# Patient Record
Sex: Male | Born: 1990 | Race: Black or African American | Hispanic: No | Smoking: Never smoker
Health system: Southern US, Community
[De-identification: ages and names within clinical notes are randomized; demographics above are authoritative.]

## PROBLEM LIST (undated history)

## (undated) DIAGNOSIS — K279 Peptic ulcer, site unspecified, unspecified as acute or chronic, without hemorrhage or perforation: Secondary | ICD-10-CM

## (undated) DIAGNOSIS — B009 Herpesviral infection, unspecified: Secondary | ICD-10-CM

## (undated) DIAGNOSIS — K922 Gastrointestinal hemorrhage, unspecified: Secondary | ICD-10-CM

## (undated) DIAGNOSIS — D62 Acute posthemorrhagic anemia: Secondary | ICD-10-CM

## (undated) HISTORY — DX: Acute posthemorrhagic anemia: D62

## (undated) HISTORY — DX: Gastrointestinal hemorrhage, unspecified: K92.2

## (undated) HISTORY — PX: ESOPHAGOGASTRODUODENOSCOPY ENDOSCOPY: SHX5814

## (undated) HISTORY — PX: WISDOM TOOTH EXTRACTION: SHX21

---

## 2013-10-04 ENCOUNTER — Inpatient Hospital Stay (HOSPITAL_COMMUNITY)
Admission: EM | Admit: 2013-10-04 | Discharge: 2013-10-08 | DRG: 378 | Disposition: A | Discharge status: Home / Self Care | Payer: BC Managed Care – PPO | Attending: Internal Medicine | Admitting: Internal Medicine

## 2013-10-04 ENCOUNTER — Encounter (HOSPITAL_COMMUNITY): Payer: Self-pay | Admitting: Emergency Medicine

## 2013-10-04 DIAGNOSIS — R7989 Other specified abnormal findings of blood chemistry: Secondary | ICD-10-CM | POA: Diagnosis present

## 2013-10-04 DIAGNOSIS — D509 Iron deficiency anemia, unspecified: Secondary | ICD-10-CM | POA: Diagnosis present

## 2013-10-04 DIAGNOSIS — Z79899 Other long term (current) drug therapy: Secondary | ICD-10-CM

## 2013-10-04 DIAGNOSIS — K2980 Duodenitis without bleeding: Secondary | ICD-10-CM | POA: Diagnosis present

## 2013-10-04 DIAGNOSIS — R000 Tachycardia, unspecified: Secondary | ICD-10-CM | POA: Diagnosis present

## 2013-10-04 DIAGNOSIS — K279 Peptic ulcer, site unspecified, unspecified as acute or chronic, without hemorrhage or perforation: Secondary | ICD-10-CM | POA: Diagnosis present

## 2013-10-04 DIAGNOSIS — R609 Edema, unspecified: Secondary | ICD-10-CM | POA: Diagnosis present

## 2013-10-04 DIAGNOSIS — D62 Acute posthemorrhagic anemia: Secondary | ICD-10-CM | POA: Diagnosis present

## 2013-10-04 DIAGNOSIS — Z8711 Personal history of peptic ulcer disease: Secondary | ICD-10-CM

## 2013-10-04 DIAGNOSIS — K922 Gastrointestinal hemorrhage, unspecified: Secondary | ICD-10-CM

## 2013-10-04 DIAGNOSIS — B009 Herpesviral infection, unspecified: Secondary | ICD-10-CM | POA: Diagnosis present

## 2013-10-04 DIAGNOSIS — K264 Chronic or unspecified duodenal ulcer with hemorrhage: Principal | ICD-10-CM | POA: Diagnosis present

## 2013-10-04 DIAGNOSIS — J029 Acute pharyngitis, unspecified: Secondary | ICD-10-CM | POA: Diagnosis not present

## 2013-10-04 HISTORY — DX: Herpesviral infection, unspecified: B00.9

## 2013-10-04 HISTORY — DX: Peptic ulcer, site unspecified, unspecified as acute or chronic, without hemorrhage or perforation: K27.9

## 2013-10-04 LAB — BASIC METABOLIC PANEL
BUN: 26 mg/dL — ABNORMAL HIGH (ref 6–23)
CALCIUM: 9.1 mg/dL (ref 8.4–10.5)
CO2: 23 mEq/L (ref 19–32)
Chloride: 102 mEq/L (ref 96–112)
Creatinine, Ser: 1.1 mg/dL (ref 0.50–1.35)
GFR calc non Af Amer: >90 mL/min (ref >90)
Glucose, Bld: 134 mg/dL — ABNORMAL HIGH (ref 70–99)
POTASSIUM: 4.1 meq/L (ref 3.7–5.3)
Sodium: 137 mEq/L (ref 137–147)

## 2013-10-04 LAB — CBC WITH DIFFERENTIAL/PLATELET
BASOS PCT: 0 % (ref 0–1)
Basophils Absolute: 0 10*3/uL (ref 0.0–0.1)
Eosinophils Absolute: 0.1 10*3/uL (ref 0.0–0.7)
Eosinophils Relative: 1 % (ref 0–5)
HCT: 35.6 % — ABNORMAL LOW (ref 39.0–52.0)
HEMOGLOBIN: 11.7 g/dL — AB (ref 13.0–17.0)
LYMPHS ABS: 1.4 10*3/uL (ref 0.7–4.0)
Lymphocytes Relative: 13 % (ref 12–46)
MCH: 26.3 pg (ref 26.0–34.0)
MCHC: 32.9 g/dL (ref 30.0–36.0)
MCV: 80 fL (ref 78.0–100.0)
MONOS PCT: 7 % (ref 3–12)
Monocytes Absolute: 0.7 10*3/uL (ref 0.1–1.0)
NEUTROS ABS: 8.2 10*3/uL — AB (ref 1.7–7.7)
Neutrophils Relative %: 79 % — ABNORMAL HIGH (ref 43–77)
PLATELETS: 256 10*3/uL (ref 150–400)
RBC: 4.45 MIL/uL (ref 4.22–5.81)
RDW: 14.3 % (ref 11.5–15.5)
WBC: 10.4 10*3/uL (ref 4.0–10.5)

## 2013-10-04 NOTE — ED Notes (Signed)
Pt presents with c/o rectal bleeding that started today. Pt says that the bleeding has been both bright red blood and dark blood, also experiencing some abdominal pain and dizziness. Ambulatory in triage.

## 2013-10-05 ENCOUNTER — Encounter (HOSPITAL_COMMUNITY)
Admission: EM | Disposition: A | Discharge status: Home / Self Care | Payer: Self-pay | Source: Home / Self Care | Attending: Internal Medicine

## 2013-10-05 ENCOUNTER — Encounter (HOSPITAL_COMMUNITY): Payer: Self-pay | Admitting: Internal Medicine

## 2013-10-05 ENCOUNTER — Emergency Department (HOSPITAL_COMMUNITY): Payer: BC Managed Care – PPO

## 2013-10-05 DIAGNOSIS — K922 Gastrointestinal hemorrhage, unspecified: Secondary | ICD-10-CM

## 2013-10-05 DIAGNOSIS — K279 Peptic ulcer, site unspecified, unspecified as acute or chronic, without hemorrhage or perforation: Secondary | ICD-10-CM | POA: Diagnosis present

## 2013-10-05 HISTORY — DX: Gastrointestinal hemorrhage, unspecified: K92.2

## 2013-10-05 HISTORY — PX: ESOPHAGOGASTRODUODENOSCOPY: SHX5428

## 2013-10-05 LAB — URINALYSIS, ROUTINE W REFLEX MICROSCOPIC
Bilirubin Urine: NEGATIVE
GLUCOSE, UA: NEGATIVE mg/dL
HGB URINE DIPSTICK: NEGATIVE
Ketones, ur: NEGATIVE mg/dL
LEUKOCYTES UA: NEGATIVE
Nitrite: NEGATIVE
PROTEIN: NEGATIVE mg/dL
SPECIFIC GRAVITY, URINE: 1.028 (ref 1.005–1.030)
UROBILINOGEN UA: 0.2 mg/dL (ref 0.0–1.0)
pH: 6 (ref 5.0–8.0)

## 2013-10-05 LAB — COMPREHENSIVE METABOLIC PANEL
ALT: 13 U/L (ref 0–53)
AST: 18 U/L (ref 0–37)
Albumin: 3.2 g/dL — ABNORMAL LOW (ref 3.5–5.2)
Alkaline Phosphatase: 42 U/L (ref 39–117)
BUN: 27 mg/dL — AB (ref 6–23)
CO2: 24 mEq/L (ref 19–32)
CREATININE: 1.09 mg/dL (ref 0.50–1.35)
Calcium: 8.4 mg/dL (ref 8.4–10.5)
Chloride: 104 mEq/L (ref 96–112)
GFR calc non Af Amer: >90 mL/min (ref >90)
GLUCOSE: 112 mg/dL — AB (ref 70–99)
POTASSIUM: 4.2 meq/L (ref 3.7–5.3)
Sodium: 139 mEq/L (ref 137–147)
TOTAL PROTEIN: 5.7 g/dL — AB (ref 6.0–8.3)
Total Bilirubin: 0.4 mg/dL (ref 0.3–1.2)

## 2013-10-05 LAB — HEMOGLOBIN AND HEMATOCRIT, BLOOD
HCT: 27.6 % — ABNORMAL LOW (ref 39.0–52.0)
HEMATOCRIT: 30.4 % — AB (ref 39.0–52.0)
HEMATOCRIT: 27.4 % — AB (ref 39.0–52.0)
HEMOGLOBIN: 9.4 g/dL — AB (ref 13.0–17.0)
Hemoglobin: 10.4 g/dL — ABNORMAL LOW (ref 13.0–17.0)
Hemoglobin: 9.2 g/dL — ABNORMAL LOW (ref 13.0–17.0)

## 2013-10-05 LAB — MAGNESIUM: MAGNESIUM: 1.7 mg/dL (ref 1.5–2.5)

## 2013-10-05 LAB — CBC
HEMATOCRIT: 30 % — AB (ref 39.0–52.0)
Hemoglobin: 10.2 g/dL — ABNORMAL LOW (ref 13.0–17.0)
MCH: 27.1 pg (ref 26.0–34.0)
MCHC: 34 g/dL (ref 30.0–36.0)
MCV: 79.6 fL (ref 78.0–100.0)
PLATELETS: 225 10*3/uL (ref 150–400)
RBC: 3.77 MIL/uL — ABNORMAL LOW (ref 4.22–5.81)
RDW: 14.2 % (ref 11.5–15.5)
WBC: 14.1 10*3/uL — AB (ref 4.0–10.5)

## 2013-10-05 LAB — TYPE AND SCREEN
ABO/RH(D): O POS
Antibody Screen: NEGATIVE

## 2013-10-05 LAB — PHOSPHORUS: Phosphorus: 2.5 mg/dL (ref 2.3–4.6)

## 2013-10-05 LAB — ABO/RH: ABO/RH(D): O POS

## 2013-10-05 LAB — TSH: TSH: 1.005 u[IU]/mL (ref 0.350–4.500)

## 2013-10-05 LAB — POC OCCULT BLOOD, ED: FECAL OCCULT BLD: POSITIVE — AB

## 2013-10-05 SURGERY — EGD (ESOPHAGOGASTRODUODENOSCOPY)
Anesthesia: Moderate Sedation

## 2013-10-05 MED ORDER — DIPHENHYDRAMINE HCL 50 MG/ML IJ SOLN
INTRAMUSCULAR | Status: AC
  Filled 2013-10-05: qty 1

## 2013-10-05 MED ORDER — MIDAZOLAM HCL 10 MG/2ML IJ SOLN
INTRAMUSCULAR | Status: AC
  Filled 2013-10-05: qty 2

## 2013-10-05 MED ORDER — VALACYCLOVIR HCL 500 MG PO TABS
250.0000 mg | ORAL_TABLET | Freq: Every day | ORAL | Status: DC
  Administered 2013-10-05 – 2013-10-08 (×4): 250 mg via ORAL
  Filled 2013-10-05 (×4): qty 0.5

## 2013-10-05 MED ORDER — SUCRALFATE 1 G PO TABS
1.0000 g | ORAL_TABLET | Freq: Three times a day (TID) | ORAL | Status: DC
  Administered 2013-10-06 – 2013-10-08 (×9): 1 g via ORAL
  Filled 2013-10-05 (×15): qty 1

## 2013-10-05 MED ORDER — ONDANSETRON HCL 4 MG/2ML IJ SOLN: 4.0000 mg | Freq: Four times a day (QID) | INTRAMUSCULAR | Status: DC | PRN

## 2013-10-05 MED ORDER — FENTANYL CITRATE 0.05 MG/ML IJ SOLN
INTRAMUSCULAR | Status: DC | PRN
  Administered 2013-10-05 (×4): 25 ug via INTRAVENOUS

## 2013-10-05 MED ORDER — FENTANYL CITRATE 0.05 MG/ML IJ SOLN
INTRAMUSCULAR | Status: AC
  Filled 2013-10-05: qty 2

## 2013-10-05 MED ORDER — ACETAMINOPHEN 325 MG PO TABS: 650.0000 mg | ORAL_TABLET | Freq: Four times a day (QID) | ORAL | Status: DC | PRN

## 2013-10-05 MED ORDER — SODIUM CHLORIDE 0.9 % IV SOLN
INTRAVENOUS | Status: DC
Start: 2013-10-05 — End: 2013-10-05
  Administered 2013-10-05: 500 mL via INTRAVENOUS

## 2013-10-05 MED ORDER — HYDROCODONE-ACETAMINOPHEN 5-325 MG PO TABS
1.0000 | ORAL_TABLET | ORAL | Status: DC | PRN
  Administered 2013-10-07: 1 via ORAL
  Filled 2013-10-05: qty 1

## 2013-10-05 MED ORDER — ONDANSETRON HCL 4 MG PO TABS: 4.0000 mg | ORAL_TABLET | Freq: Four times a day (QID) | ORAL | Status: DC | PRN

## 2013-10-05 MED ORDER — SODIUM CHLORIDE 0.9 % IV SOLN
8.0000 mg/h | INTRAVENOUS | Status: DC
  Administered 2013-10-05: 8 mg/h via INTRAVENOUS
  Filled 2013-10-05 (×4): qty 80

## 2013-10-05 MED ORDER — DIPHENHYDRAMINE HCL 50 MG/ML IJ SOLN
INTRAMUSCULAR | Status: DC | PRN
  Administered 2013-10-05: 25 mg via INTRAVENOUS

## 2013-10-05 MED ORDER — SODIUM CHLORIDE 0.9 % IV SOLN
80.0000 mg | Freq: Once | INTRAVENOUS | Status: AC
  Administered 2013-10-05: 80 mg via INTRAVENOUS
  Filled 2013-10-05: qty 80

## 2013-10-05 MED ORDER — SODIUM CHLORIDE 0.9 % IV SOLN
INTRAVENOUS | Status: DC
  Administered 2013-10-05: 03:00:00 via INTRAVENOUS

## 2013-10-05 MED ORDER — SODIUM CHLORIDE 0.9 % IJ SOLN
3.0000 mL | Freq: Two times a day (BID) | INTRAMUSCULAR | Status: DC
  Administered 2013-10-05 – 2013-10-07 (×5): 3 mL via INTRAVENOUS

## 2013-10-05 MED ORDER — SODIUM CHLORIDE 0.9 % IJ SOLN
INTRAMUSCULAR | Status: DC | PRN
  Administered 2013-10-05: 16:00:00

## 2013-10-05 MED ORDER — ACETAMINOPHEN 650 MG RE SUPP: 650.0000 mg | Freq: Four times a day (QID) | RECTAL | Status: DC | PRN

## 2013-10-05 MED ORDER — MIDAZOLAM HCL 10 MG/2ML IJ SOLN
INTRAMUSCULAR | Status: DC | PRN
  Administered 2013-10-05 (×5): 2 mg via INTRAVENOUS

## 2013-10-05 MED ORDER — PANTOPRAZOLE SODIUM 40 MG IV SOLR
40.0000 mg | Freq: Two times a day (BID) | INTRAVENOUS | Status: DC
  Administered 2013-10-08: 40 mg via INTRAVENOUS
  Filled 2013-10-05 (×2): qty 40

## 2013-10-05 NOTE — Op Note (Signed)
[facility] [address] [place], [id number]   OPERATIVE PROCEDURE REPORT  PATIENT: [name] ShortsCrawford, [name]  MR#: [id number] BIRTHDATE: 05/18/1991  GENDER: Male ENDOSCOPIST: Jeani HawkingPatrick Brenn Deziel, MD ASSISTANT:   Kandice RobinsonsGuillaume Awaka, technician and Harold BarbanLiz Honeycutt, RN PROCEDURE DATE: 10/05/2013 PROCEDURE:   EGD with control of bleeding ASA CLASS:   Class II INDICATIONS: Melena, Hematochezia, Epigastric pain, and Anemia  MEDICATIONS: Versed 10 mg IV, Fentanyl 100 mcg IV, and Benadryl 25 mg IV TOPICAL ANESTHETIC:   none  DESCRIPTION OF PROCEDURE:   After the risks benefits and alternatives of the procedure were thoroughly explained, informed consent was obtained.  The PENTAX GASTOROSCOPE [id number]  endoscope was introduced through the mouth  and advanced to the first portion of the duodenum Without limitations.      The instrument was slowly withdrawn as the mucosa was fully examined.   FINDINGS: The esophagus was normal.  In the gastric lumen there was evidence of fresh blood, but no site of bleeding was identified. The endoscope was advanced into the duodenal bulb and an actively bleeding ulcer was found.  The ulcer was 1 cm in size and there was a surrounding duodenitis.  A visible vessel was noted on the periphery.  Four quadrant injections of 1:10,000 Epi was performed, which arrested the bleeding.  BICAP was applied with the 7 Fr probe.  The treatment was very difficult to perform as he was combative with the procedure.  Successful cautery was achieved, but to ensure complete hemostasis four hemoclips were deployed.  The ulcer was not able to be closed because of the fibrosis and edema, but the edge where the visible vessel was cauterized was cliped. No further bleeding was noted.          The scope was then withdrawn from the patient and the procedure terminated.  COMPLICATIONS: There were no complications. IMPRESSION: 1) Bleeding duodenal ulcer. 2)  Duodenitis.  RECOMMENDATIONS: 1) PPI BID. 2) Sucralfate 1 gram QID. 3) Check H. pylori status. 4) Follow HGB and transfuse as necessary.  _______________________________ eSignedJeani Hawking:  Laurice Kimmons, MD 10/05/2013 4:31 PM

## 2013-10-05 NOTE — ED Provider Notes (Signed)
CSN: [id number]     Arrival date & time 10/04/13  1814 History   First MD Initiated Contact with Patient 10/05/13 [phone]     Chief Complaint  Patient presents with  . Rectal Bleeding  . Abdominal Pain     (Consider location/radiation/quality/duration/timing/severity/associated sxs/prior Treatment) Patient is a 23 y.o. male presenting with hematochezia and abdominal pain.  Rectal Bleeding Associated symptoms: abdominal pain   Associated symptoms: no fever   Abdominal Pain Associated symptoms: hematochezia   Associated symptoms: no chest pain, no chills, no dysuria, no fever and no shortness of breath    History provided by patient. Epigastric pain onset yesterday and since then has developed black and tarry stools mixed with some bright red blood. He has history of a bleeding ulcer in 2012 while he [place]. He denies any recent NSAIDs. He does drink some occasional alcohol.  He admits to a significant amount of stress at work. Pain is sharp in quality and moderate severity. Pain is not radiating. No nausea vomiting. Symptoms feel similar to previous ulcer. Last bowel movement tonight describes moderate amount of blood in the toilet  History reviewed. No pertinent past medical history. History reviewed. No pertinent past surgical history. No family history on file. History  Substance Use Topics  . Smoking status: Never Smoker   . Smokeless tobacco: Not on file  . Alcohol Use: Yes     Comment: rarely     Review of Systems  Constitutional: Negative for fever and chills.  Respiratory: Negative for shortness of breath.   Cardiovascular: Negative for chest pain.  Gastrointestinal: Positive for abdominal pain, blood in stool and hematochezia.  Genitourinary: Negative for dysuria.  Musculoskeletal: Negative for back pain, neck pain and neck stiffness.  Skin: Negative for rash.  Neurological: Negative for headaches.  All other systems reviewed and are  negative.      Allergies  Review of patient's allergies indicates no known allergies.  Home Medications   Current Outpatient Rx  Name  Route  Sig  Dispense  Refill  . valACYclovir (VALTREX) 500 MG tablet   Oral   Take 250 mg by mouth daily. Take 1/2 tablet          BP 115/70  Pulse 92  Temp(Src) 98.4 F (36.9 C) (Oral)  Resp 18  SpO2 98% Physical Exam  Constitutional: He is oriented to person, place, and time. He appears well-developed and well-nourished.  HENT:  Head: Normocephalic and atraumatic.  Mouth/Throat: Oropharynx is clear and moist.  Eyes: Conjunctivae and EOM are normal. Pupils are equal, round, and reactive to light.  Neck: Neck supple.  Cardiovascular: Regular rhythm and intact distal pulses.   Tachycardic  Pulmonary/Chest: Effort normal and breath sounds normal. No respiratory distress.  Abdominal: Soft. Bowel sounds are normal. He exhibits no distension.  Mild epigastric tenderness without rebound or guarding  Genitourinary:  Rectal exam: Nontender no masses, no stool in rectal vault  Musculoskeletal: Normal range of motion. He exhibits no edema.  Neurological: He is alert and oriented to person, place, and time.  Skin: Skin is warm and dry.    ED Course  Procedures (including critical care time) Labs Review Labs Reviewed  BASIC METABOLIC PANEL - Abnormal; Notable for the following:    Glucose, Bld 134 (*)    BUN 26 (*)    All other components within normal limits  CBC WITH DIFFERENTIAL - Abnormal; Notable for the following:    Hemoglobin 11.7 (*)  HCT 35.6 (*)    Neutrophils Relative % 79 (*)    Neutro Abs 8.2 (*)    All other components within normal limits  POC OCCULT BLOOD, ED - Abnormal; Notable for the following:    Fecal Occult Bld POSITIVE (*)    All other components within normal limits  URINALYSIS, ROUTINE W REFLEX MICROSCOPIC  TYPE AND SCREEN   Imaging Review Dg Chest 2 View  10/05/2013   CLINICAL DATA:  23 year old male  with pain and nausea.  EXAM: CHEST  2 VIEW  COMPARISON:  None  FINDINGS: The cardiomediastinal silhouette is unremarkable.  The lungs are clear.  There is no evidence of focal airspace disease, pulmonary edema, suspicious pulmonary nodule/mass, pleural effusion, or pneumothorax. No acute bony abnormalities are identified.  IMPRESSION: No active cardiopulmonary disease.   Electronically Signed   By: Laveda Abbe M.D.   On: 10/05/2013 03:00    EKG Interpretation   None      IV Protonix drip. IV fluids. Type and screen  2:35 AM discussed with Dr. Loreta Ave, GI will follow  2:43 AM d/w DR Adela Glimpse, will evaluate for admit  MDM   Dx: GI Bleed  Labs and imaging obtained and reviewed. IV fluids. IV medications. GI will follow. Medical admission    Sunnie Nielsen, MD 10/05/13 [phone]

## 2013-10-05 NOTE — H&P (Signed)
PCP: none   Chief Complaint:  Black stools with maroon blood  HPI: [name] Phillips is a 23 y.o. male   has a past medical history of PUD (peptic ulcer disease) and Herpes.   Presented with  24 hour hx of black stool but progressed to bright red blood. Last BM 3 hours ago. Reports some lower abdominal discomfort. Denies recent NSAID use. Denies epigastric pain. Initially was noted to be tachycardic up to 120's after IV improved. Hg 11.7. [facility]. ER spoke with Dr. Loreta AveMann who is aware of the patient. Hospitlist called for admission.   Review of Systems:    Pertinent positives include: melena, abdominal pain, diarrhea,  Constitutional:  No weight loss, night sweats, Fevers, chills, fatigue, weight loss  HEENT:  No headaches, Difficulty swallowing,Tooth/dental problems,Sore throat,  No sneezing, itching, ear ache, nasal congestion, post nasal drip,  Cardio-vascular:  No chest pain, Orthopnea, PND, anasarca, dizziness, palpitations.no Bilateral lower extremity swelling  GI:  No heartburn, indigestion,  nausea, vomiting,  change in bowel habits, loss of appetite,  blood in stool, hematemesis Resp:  no shortness of breath at rest. No dyspnea on exertion, No excess mucus, no productive cough, No non-productive cough, No coughing up of blood.No change in color of mucus.No wheezing. Skin:  no rash or lesions. No jaundice GU:  no dysuria, change in color of urine, no urgency or frequency. No straining to urinate.  No flank pain.  Musculoskeletal:  No joint pain or no joint swelling. No decreased range of motion. No back pain.  Psych:  No change in mood or affect. No depression or anxiety. No memory loss.  Neuro: no localizing neurological complaints, no tingling, no weakness, no double vision, no gait abnormality, no slurred speech, no confusion  Otherwise ROS are negative except for above, 10 systems were reviewed  Past  Medical History: Past Medical History  Diagnosis Date  . PUD (peptic ulcer disease)   . Herpes    History reviewed. No pertinent past surgical history.   Medications: Prior to Admission medications   Medication Sig Start Date End Date Taking? Authorizing Provider  valACYclovir (VALTREX) 500 MG tablet Take 250 mg by mouth daily. Take 1/2 tablet   Yes Historical Provider, MD    Allergies:  No Known Allergies  Social History:  Ambulatory  Independently  Lives at   Home alone   reports that he has never smoked. He does not have any smokeless tobacco history on file. He reports that he drinks alcohol. He reports that he does not use illicit drugs.   Family History: family history includes Asthma in his brother; CAD in his mother; Diabetes Mellitus II in his mother; Hypertension in his father; Prostate cancer in his father; Stroke in his mother.    Physical Exam: Patient Vitals for the past 24 hrs:  BP Temp Temp src Pulse Resp SpO2  10/05/13 0342 91/56 mmHg 98.5 F (36.9 C) Oral 93 - 97 %  10/04/13 2320 115/70 mmHg 98.4 F (36.9 C) Oral 92 18 98 %  10/04/13 2029 109/57 mmHg 97.7 F (36.5 C) Oral 125 22 100 %  10/04/13 1930 124/70 mmHg 98.7 F (37.1 C) Oral 107 20 100 %    1. General:  in No Acute distress 2. Psychological: Alert and   Oriented 3. Head/ENT:   Moist   Mucous Membranes  Head Non traumatic, neck supple                          Normal  Dentition 4. SKIN: normal  Skin turgor,  Skin clean Dry and intact no rash 5. Heart: Regular rate and rhythm no Murmur, Rub or gallop 6. Lungs: Clear to auscultation bilaterally, no wheezes or crackles   7. Abdomen: Soft, gastric tenderness as well as periumbilical tenderness, Non distended 8. Lower extremities: no clubbing, cyanosis, or edema 9. Neurologically Grossly intact, moving all 4 extremities equally 10. MSK: Normal range of motion Hemoccult possitive  body mass index is unknown because  there is no height or weight on file.   Labs on Admission:   Recent Labs  10/04/13 2207  NA 137  K 4.1  CL 102  CO2 23  GLUCOSE 134*  BUN 26*  CREATININE 1.10  CALCIUM 9.1   No results found for this basename: AST, ALT, ALKPHOS, BILITOT, PROT, ALBUMIN,  in the last 72 hours No results found for this basename: LIPASE, AMYLASE,  in the last 72 hours  Recent Labs  10/04/13 2207  WBC 10.4  NEUTROABS 8.2*  HGB 11.7*  HCT 35.6*  MCV 80.0  PLT 256   No results found for this basename: CKTOTAL, CKMB, CKMBINDEX, TROPONINI,  in the last 72 hours No results found for this basename: TSH, T4TOTAL, FREET3, T3FREE, THYROIDAB,  in the last 72 hours No results found for this basename: VITAMINB12, FOLATE, FERRITIN, TIBC, IRON, RETICCTPCT,  in the last 72 hours No results found for this basename: HGBA1C    CrCl is unknown because there is no height on file for the current visit. ABG No results found for this basename: phart, pco2, po2, hco3, tco2, acidbasedef, o2sat     No results found for this basename: DDIMER       UA no infection   Cultures: No results found for this basename: sdes, specrequest, cult, reptstatus       Radiological Exams on Admission: Dg Chest 2 View  10/05/2013   CLINICAL DATA:  23 year old male with pain and nausea.  EXAM: CHEST  2 VIEW  COMPARISON:  None  FINDINGS: The cardiomediastinal silhouette is unremarkable.  The lungs are clear.  There is no evidence of focal airspace disease, pulmonary edema, suspicious pulmonary nodule/mass, pleural effusion, or pneumothorax. No acute bony abnormalities are identified.  IMPRESSION: No active cardiopulmonary disease.   Electronically Signed   By: Laveda Abbe M.D.   On: 10/05/2013 03:00    Chart has been reviewed  Assessment/Plan  23 year-old gentleman history of peptic ulcer disease diagnosed in [facility] in [place]. Here with possibly upper GI bleed.  Present on Admission:  . GI bleed - possibly upper  GI source admit to step down, serial ECG C. scars Protonix drip. GI consult to see in a.m. patient likely benefit from EGD followed by colonoscopy if no source was found  . PUD (peptic ulcer disease) - Protonix drip   Prophylaxis: SCD , Protonix  CODE STATUS: FULL CODE  Other plan as per orders.  I have spent a total of  50 min on this admission  [name] Phillips 10/05/2013, 4:16 AM

## 2013-10-05 NOTE — Consult Note (Signed)
Unassigned Consult  Reason for Consult: Melena and hematochezia Referring Physician: [facility]  [name] Phillips HPI: This is a 23 year old male with a history of GI bleed in 2012 admitted for melena, ABM pain, and some hematochezia.  The patient reports that his symptoms started this past weekend with some abdominal discomfort.  He thought that it was a minor GI upset and he took an over-the-counter remedy.  His symptoms worsened yesterday and he thought that this was as a result of fatigue from work, however, he started to have melenic stools.  Yesterday morning he had a couple and then a few more episodes in the afternoon.  As a result of his symptoms he presented to the ER for further evaluation.  His HGB was noted to be low and he was tachycardic.  No recent use of any NSAIDs.  Valtrex is the only new medication that was started for him.  Past Medical History  Diagnosis Date  . PUD (peptic ulcer disease)   . Herpes     Past Surgical History  Procedure Laterality Date  . Wisdom tooth extraction    . Esophagogastroduodenoscopy endoscopy      Family History  Problem Relation Age of Onset  . CAD Mother   . Diabetes Mellitus II Mother   . Stroke Mother   . Prostate cancer Father   . Hypertension Father   . Asthma Brother     Social History:  reports that he has never smoked. He has never used smokeless tobacco. He reports that he drinks alcohol. He reports that he does not use illicit drugs.  Allergies: No Known Allergies  Medications:  Scheduled: . [START ON 10/08/2013] pantoprazole (PROTONIX) IV  40 mg Intravenous Q12H  . sodium chloride  3 mL Intravenous Q12H  . valACYclovir  250 mg Oral Daily   Continuous: . sodium chloride 125 mL/hr at 10/05/13 0259  . pantoprozole (PROTONIX) infusion 8 mg/hr (10/05/13 0437)    Results for orders placed during the hospital encounter of 10/04/13 (from the past 24 hour(s))  BASIC METABOLIC PANEL     Status: Abnormal   Collection Time    10/04/13 10:07 PM      Result Value Ref Range   Sodium 137  137 - 147 mEq/L   Potassium 4.1  3.7 - 5.3 mEq/L   Chloride 102  96 - 112 mEq/L   CO2 23  19 - 32 mEq/L   Glucose, Bld 134 (*) 70 - 99 mg/dL   BUN 26 (*) 6 - 23 mg/dL   Creatinine, Ser 0.861.10  0.50 - 1.35 mg/dL   Calcium 9.1  8.4 - 57.810.5 mg/dL   GFR calc non Af Amer >90  >90 mL/min   GFR calc Af Amer >90  >90 mL/min  CBC WITH DIFFERENTIAL     Status: Abnormal   Collection Time    10/04/13 10:07 PM      Result Value Ref Range   WBC 10.4  4.0 - 10.5 K/uL   RBC 4.45  4.22 - 5.81 MIL/uL   Hemoglobin 11.7 (*) 13.0 - 17.0 g/dL   HCT 46.935.6 (*) 62.939.0 - 52.852.0 %   MCV 80.0  78.0 - 100.0 fL   MCH 26.3  26.0 - 34.0 pg   MCHC 32.9  30.0 - 36.0 g/dL   RDW 41.314.3  24.411.5 - 01.015.5 %   Platelets 256  150 - 400 K/uL   Neutrophils Relative % 79 (*) 43 - 77 %   Neutro  Abs 8.2 (*) 1.7 - 7.7 K/uL   Lymphocytes Relative 13  12 - 46 %   Lymphs Abs 1.4  0.7 - 4.0 K/uL   Monocytes Relative 7  3 - 12 %   Monocytes Absolute 0.7  0.1 - 1.0 K/uL   Eosinophils Relative 1  0 - 5 %   Eosinophils Absolute 0.1  0.0 - 0.7 K/uL   Basophils Relative 0  0 - 1 %   Basophils Absolute 0.0  0.0 - 0.1 K/uL  URINALYSIS, ROUTINE W REFLEX MICROSCOPIC     Status: None   Collection Time    10/05/13 12:17 AM      Result Value Ref Range   Color, Urine YELLOW  YELLOW   APPearance CLEAR  CLEAR   Specific Gravity, Urine 1.028  1.005 - 1.030   pH 6.0  5.0 - 8.0   Glucose, UA NEGATIVE  NEGATIVE mg/dL   Hgb urine dipstick NEGATIVE  NEGATIVE   Bilirubin Urine NEGATIVE  NEGATIVE   Ketones, ur NEGATIVE  NEGATIVE mg/dL   Protein, ur NEGATIVE  NEGATIVE mg/dL   Urobilinogen, UA 0.2  0.0 - 1.0 mg/dL   Nitrite NEGATIVE  NEGATIVE   Leukocytes, UA NEGATIVE  NEGATIVE  POC OCCULT BLOOD, ED     Status: Abnormal   Collection Time    10/05/13  2:02 AM      Result Value Ref Range   Fecal Occult Bld POSITIVE (*) NEGATIVE  TYPE AND SCREEN     Status: None    Collection Time    10/05/13  2:35 AM      Result Value Ref Range   ABO/RH(D) O POS     Antibody Screen NEG     Sample Expiration 10/08/2013    ABO/RH     Status: None   Collection Time    10/05/13  2:35 AM      Result Value Ref Range   ABO/RH(D) O POS    MAGNESIUM     Status: None   Collection Time    10/05/13  6:13 AM      Result Value Ref Range   Magnesium 1.7  1.5 - 2.5 mg/dL  PHOSPHORUS     Status: None   Collection Time    10/05/13  6:13 AM      Result Value Ref Range   Phosphorus 2.5  2.3 - 4.6 mg/dL  TSH     Status: None   Collection Time    10/05/13  6:13 AM      Result Value Ref Range   TSH 1.005  0.350 - 4.500 uIU/mL  COMPREHENSIVE METABOLIC PANEL     Status: Abnormal   Collection Time    10/05/13  6:13 AM      Result Value Ref Range   Sodium 139  137 - 147 mEq/L   Potassium 4.2  3.7 - 5.3 mEq/L   Chloride 104  96 - 112 mEq/L   CO2 24  19 - 32 mEq/L   Glucose, Bld 112 (*) 70 - 99 mg/dL   BUN 27 (*) 6 - 23 mg/dL   Creatinine, Ser 1.61  0.50 - 1.35 mg/dL   Calcium 8.4  8.4 - 09.6 mg/dL   Total Protein 5.7 (*) 6.0 - 8.3 g/dL   Albumin 3.2 (*) 3.5 - 5.2 g/dL   AST 18  0 - 37 U/L   ALT 13  0 - 53 U/L   Alkaline Phosphatase 42  39 - 117 U/L  Total Bilirubin 0.4  0.3 - 1.2 mg/dL   GFR calc non Af Amer >90  >90 mL/min   GFR calc Af Amer >90  >90 mL/min  CBC     Status: Abnormal   Collection Time    10/05/13  6:13 AM      Result Value Ref Range   WBC 14.1 (*) 4.0 - 10.5 K/uL   RBC 3.77 (*) 4.22 - 5.81 MIL/uL   Hemoglobin 10.2 (*) 13.0 - 17.0 g/dL   HCT 40.9 (*) 81.1 - 91.4 %   MCV 79.6  78.0 - 100.0 fL   MCH 27.1  26.0 - 34.0 pg   MCHC 34.0  30.0 - 36.0 g/dL   RDW 78.2  95.6 - 21.3 %   Platelets 225  150 - 400 K/uL  HEMOGLOBIN AND HEMATOCRIT, BLOOD     Status: Abnormal   Collection Time    10/05/13 10:33 AM      Result Value Ref Range   Hemoglobin 10.4 (*) 13.0 - 17.0 g/dL   HCT 08.6 (*) 57.8 - 46.9 %     Dg Chest 2 View  10/05/2013   CLINICAL  DATA:  23 year old male with pain and nausea.  EXAM: CHEST  2 VIEW  COMPARISON:  None  FINDINGS: The cardiomediastinal silhouette is unremarkable.  The lungs are clear.  There is no evidence of focal airspace disease, pulmonary edema, suspicious pulmonary nodule/mass, pleural effusion, or pneumothorax. No acute bony abnormalities are identified.  IMPRESSION: No active cardiopulmonary disease.   Electronically Signed   By: Laveda Abbe M.D.   On: 10/05/2013 03:00    ROS:  As stated above in the HPI otherwise negative.  Blood pressure 124/71, pulse 72, temperature 98.4 F (36.9 C), temperature source Oral, resp. rate 19, SpO2 100.00%.    PE: Gen: NAD, Alert and Oriented HEENT:  Home/AT, EOMI Neck: Supple, no LAD Lungs: CTA Bilaterally CV: RRR without M/G/R ABM: Soft, mild epigastric discomfort, +BS Ext: No C/C/E  Assessment/Plan: 1) Melena. 2) ABM pain. 3) Anemia. 4) Tachycardia.   With his symptoms I will pursue an emergent EGD.  There is a report that his melena turned into hematochezia.  The tachycardia would also support this issue.  He may have a rapid upper GI bleed.  My office did try to obtain preauthorization clearance for the EGD as he has BCBS, however, their office is closed today as a result of the weather.  Regardless an EGD is required.  Plan: 1) EGD. 2) Follow HGB. 3) Transfuse if necessary. 4) PPI.  [name] Phillips D 10/05/2013, 12:04 PM

## 2013-10-05 NOTE — Progress Notes (Signed)
UR completed 

## 2013-10-05 NOTE — Progress Notes (Signed)
[name] ShortsGeorge Phillips is a 23 y.o. male has a past medical history of PUD (peptic ulcer disease) and Herpes. Presented with 24 hour hx of black stool but progressed to bright red blood. Last BM 3 hours ago. Reports some lower abdominal discomfort. Denies recent NSAID use. Denies epigastric pain. Initially was noted to be tachycardic up to 120's after IV improved. Hg 11.7. He [facility].  ER spoke with Dr. Loreta AveMann who is aware of the patient. Hospitlist called for admission.   Pt seen and examined.  Gi consulted and he will undergo EGD today.  Q6hr H&H and transfuse when Hemoglobin drops to les than 8 . tranferred the patient to telemetry.    Kathlen ModyVijaya Madgie Dhaliwal, MD [phone]

## 2013-10-06 ENCOUNTER — Encounter (HOSPITAL_COMMUNITY): Payer: Self-pay | Admitting: Gastroenterology

## 2013-10-06 LAB — HEMOGLOBIN AND HEMATOCRIT, BLOOD
HEMATOCRIT: 26.7 % — AB (ref 39.0–52.0)
HEMATOCRIT: 27.7 % — AB (ref 39.0–52.0)
HEMOGLOBIN: 9.5 g/dL — AB (ref 13.0–17.0)
Hemoglobin: 9 g/dL — ABNORMAL LOW (ref 13.0–17.0)

## 2013-10-06 LAB — H. PYLORI ANTIBODY, IGG

## 2013-10-06 NOTE — Care Management Note (Addendum)
    Page 1 of 1   10/08/2013     1:34:48 PM   CARE MANAGEMENT NOTE 10/08/2013  Patient:  [name] Phillips Phillips,[name] Phillips   Account Number:  [id number]  Date Initiated:  10/06/2013  Documentation initiated by:  Lanier ClamMAHABIR,Karalyne Nusser  Subjective/Objective Assessment:   22 Y/O M ADMITTED W/GIB.     Action/Plan:   FROM HOME.NO PCP.   Anticipated DC Date:  10/08/2013   Anticipated DC Plan:  HOME/SELF CARE      DC Planning Services  CM consult      Choice offered to / List presented to:             Status of service:  In process, will continue to follow Medicare Important Message given?   (If response is "NO", the following Medicare IM given date fields will be blank) Date Medicare IM given:   Date Additional Medicare IM given:    Discharge Disposition:    Per UR Regulation:  Reviewed for med. necessity/level of care/duration of stay  If discussed at Long Length of Stay Meetings, dates discussed:    Comments:  10/08/13 [name] Phillips Maciolek RN,BSN NCM [phone] NO ANTICIPATED D/C NEEDS.  10/07/13 [name] Phillips Yousuf RN,BSN NCM [phone] SPOKE TO PATIENT ABOUT CHOOSING A PCP,HE WILL GET ONE BEFORE D/C.ALSO INFORMED PATIENT THAT HIS INSURANCE WILL GET BILLED THE SERVICES WE PROVIDED HERE IN THE HOSPITAL.THE INSURANCE WILL APPLY HIS DEDUCTIBLE,& CO PAY FOR HIS PORTION OF PAYMENT.HE VOICED UNDERSTANDING.  10/06/13 [name] Phillips Deremer RN,BSN NCM [phone] PROVIDED W/PCP LISTING.INFORMED PATIENT ALSO TO CONTACT INSURANCE CUST SREVICE TEL# FOR PCP.PATIENT VOICED UNDERSTANDING.

## 2013-10-06 NOTE — Progress Notes (Signed)
Pt c/o bloody nose at this time. Ice pack given to place over his nose. Will cont to monitor.

## 2013-10-06 NOTE — Progress Notes (Signed)
[facility] PROGRESS NOTE  [name] Phillips ZOX:[id number] DOB: 1990-12-03 DOA: 10/04/2013 PCP: No primary provider on file.  Assessment/Plan: Acute Upper GI Bleed/PUD -EGD with bleeding duodenal ulcer. -No further episodes of bleeding. -Hb has remained stable around 9-9.5 since admission. -Continue PPI BID. -Advance diet.  Code Status: Full code Family Communication: Patient only  Disposition Plan: Home when ready; likely 24 hours.   Consultants:  GI, Dr. Elnoria Howard   Antibiotics:  None   Subjective: No further bleeding. No complaints.  Objective: Filed Vitals:   10/05/13 1646 10/05/13 2158 10/06/13 0230 10/06/13 0440  BP: 124/63 132/64 139/83 126/59  Pulse: 113 101 81 79  Temp: 97.9 F (36.6 C) 98 F (36.7 C) 97.5 F (36.4 C) 98 F (36.7 C)  TempSrc: Oral Oral Oral Oral  Resp: 16 16 14 18   Height:      Weight:      SpO2: 100% 99% 98% 100%   No intake or output data in the 24 hours ending 10/06/13 0951 Filed Weights   10/05/13 1527  Weight: 102.059 kg (225 lb)    Exam:   General:  AA Ox3, NAD  Cardiovascular: RRR  Respiratory: CTA B  Abdomen: S/NT/ND/+BS  Extremities: no C/C/E   Neurologic:  Intact, non-focal.  Data Reviewed: Basic Metabolic Panel:  Recent Labs Lab 10/04/13 2207 10/05/13 0613  NA 137 139  K 4.1 4.2  CL 102 104  CO2 23 24  GLUCOSE 134* 112*  BUN 26* 27*  CREATININE 1.10 1.09  CALCIUM 9.1 8.4  MG  --  1.7  PHOS  --  2.5   Liver Function Tests:  Recent Labs Lab 10/05/13 0613  AST 18  ALT 13  ALKPHOS 42  BILITOT 0.4  PROT 5.7*  ALBUMIN 3.2*   No results found for this basename: LIPASE, AMYLASE,  in the last 168 hours No results found for this basename: AMMONIA,  in the last 168 hours CBC:  Recent Labs Lab 10/04/13 2207 10/05/13 0613 10/05/13 1033 10/05/13 1735 10/05/13 2133 10/06/13 0320 10/06/13 0916  WBC 10.4 14.1*  --   --   --   --   --   NEUTROABS 8.2*  --   --   --   --   --   --   HGB  11.7* 10.2* 10.4* 9.4* 9.2* 9.0* 9.5*  HCT 35.6* 30.0* 30.4* 27.6* 27.4* 26.7* 27.7*  MCV 80.0 79.6  --   --   --   --   --   PLT 256 225  --   --   --   --   --    Cardiac Enzymes: No results found for this basename: CKTOTAL, CKMB, CKMBINDEX, TROPONINI,  in the last 168 hours BNP (last 3 results) No results found for this basename: PROBNP,  in the last 8760 hours CBG: No results found for this basename: GLUCAP,  in the last 168 hours  No results found for this or any previous visit (from the past 240 hour(s)).   Studies: Dg Chest 2 View  10/05/2013   CLINICAL DATA:  23 year old male with pain and nausea.  EXAM: CHEST  2 VIEW  COMPARISON:  None  FINDINGS: The cardiomediastinal silhouette is unremarkable.  The lungs are clear.  There is no evidence of focal airspace disease, pulmonary edema, suspicious pulmonary nodule/mass, pleural effusion, or pneumothorax. No acute bony abnormalities are identified.  IMPRESSION: No active cardiopulmonary disease.   Electronically Signed   By: Laveda Abbe M.D.   On:  10/05/2013 03:00    Scheduled Meds: . [START ON 10/08/2013] pantoprazole (PROTONIX) IV  40 mg Intravenous Q12H  . sodium chloride  3 mL Intravenous Q12H  . sucralfate  1 g Oral TID WC & HS  . valACYclovir  250 mg Oral Daily   Continuous Infusions: . sodium chloride Stopped (10/05/13 1445)    Principal Problem:   GI bleed Active Problems:   PUD (peptic ulcer disease)    Time spent: 35 minutes.  Greater than 50% of this time was spent in direct contact with the patient coordinating care.    Chaya JanHERNANDEZ ACOSTA,ESTELA  [facility] Pager [phone]  If 7PM-7AM, please contact night-coverage at [email or web address], password [email or web address] 10/06/2013, 9:51 AM  LOS: 2 days

## 2013-10-06 NOTE — Progress Notes (Signed)
Unassigned patient Subjective:   Patient admitted with melena and rectal bleeding, found to a have a duodenal ulcer with a visible vessel that was treated with bicap-with successful hemostasis. The patient seems to be doing very well today and has tolerated a full liquid diet without any problems. No BM today. Has a sore throat but denies any dysphagia. There are no complaints of abdominal pain, nausea or vomiting.   Objective: Vital signs in last 24 hours: Temp:  [97.5 F (36.4 C)-98.5 F (36.9 C)] 98.5 F (36.9 C) (02/25 1339) Pulse Rate:  [79-111] 111 (02/25 1339) Resp:  [14-18] 16 (02/25 1339) BP: (126-145)/(58-83) 145/66 mmHg (02/25 1339) SpO2:  [98 %-100 %] 100 % (02/25 1339) Last BM Date: 10/04/13    General appearance: alert, cooperative, appears stated age and no distress Resp: clear to auscultation bilaterally Cardio: regular rate and rhythm, S1, S2 normal, no murmur, click, rub or gallop GI: soft, non-tender; bowel sounds normal; no masses,  no organomegaly Extremities: extremities normal, atraumatic, no cyanosis or edema  Lab Results:  Recent Labs  10/04/13 2207 10/05/13 0613  10/05/13 2133 10/06/13 0320 10/06/13 0916  WBC 10.4 14.1*  --   --   --   --   HGB 11.7* 10.2*  < > 9.2* 9.0* 9.5*  HCT 35.6* 30.0*  < > 27.4* 26.7* 27.7*  PLT 256 225  --   --   --   --   < > = values in this interval not displayed. BMET  Recent Labs  10/04/13 2207 10/05/13 0613  NA 137 139  K 4.1 4.2  CL 102 104  CO2 23 24  GLUCOSE 134* 112*  BUN 26* 27*  CREATININE 1.10 1.09  CALCIUM 9.1 8.4   LFT  Recent Labs  10/05/13 0613  PROT 5.7*  ALBUMIN 3.2*  AST 18  ALT 13  ALKPHOS 42  BILITOT 0.4   PStudies/Results: Dg Chest 2 View  10/05/2013   CLINICAL DATA:  23 year old male with pain and nausea.  EXAM: CHEST  2 VIEW  COMPARISON:  None  FINDINGS: The cardiomediastinal silhouette is unremarkable.  The lungs are clear.  There is no evidence of focal airspace disease,  pulmonary edema, suspicious pulmonary nodule/mass, pleural effusion, or pneumothorax. No acute bony abnormalities are identified.  IMPRESSION: No active cardiopulmonary disease.   Electronically Signed   By: Laveda AbbeJeff  Hu M.D.   On: 10/05/2013 03:00   Medications: I have reviewed the patient's current medications.  Assessment/Plan: 1) Duodenal ulcer with a visible vessel-treated with  Bicap-currently stable. We will gradually advance his diet and send him home on PPI's and Sucralfate suspension. His Helicobacter pylori status needs to be checked and he needs to be treated if he is positive for H. Pylori. Eradication should be documented by a breath test thereafter. He should follow up with Dr. Elnoria HowardHung.   2) Iron deficiency anemia: s/p GI bleed-currently stable.  3) Elevated BUN secondary to GI bleeding. Will recheck BMP and CBC in AM.   LOS: 2 days   Wyatt Galvan 10/06/2013, 5:35 PM

## 2013-10-06 NOTE — Progress Notes (Signed)
Pt had bloody stool in commode. Stated that his abd had been cramping prior to having a bm but not at this time. Will cont to monitor.

## 2013-10-07 DIAGNOSIS — D62 Acute posthemorrhagic anemia: Secondary | ICD-10-CM

## 2013-10-07 HISTORY — DX: Acute posthemorrhagic anemia: D62

## 2013-10-07 LAB — CBC
HEMATOCRIT: 24.3 % — AB (ref 39.0–52.0)
HEMATOCRIT: 25.3 % — AB (ref 39.0–52.0)
HEMOGLOBIN: 8.6 g/dL — AB (ref 13.0–17.0)
Hemoglobin: 8.1 g/dL — ABNORMAL LOW (ref 13.0–17.0)
MCH: 26.8 pg (ref 26.0–34.0)
MCH: 27.1 pg (ref 26.0–34.0)
MCHC: 33.3 g/dL (ref 30.0–36.0)
MCHC: 34 g/dL (ref 30.0–36.0)
MCV: 80.5 fL (ref 78.0–100.0)
MCV: 79.8 fL (ref 78.0–100.0)
PLATELETS: 201 10*3/uL (ref 150–400)
Platelets: 257 10*3/uL (ref 150–400)
RBC: 3.02 MIL/uL — ABNORMAL LOW (ref 4.22–5.81)
RBC: 3.17 MIL/uL — ABNORMAL LOW (ref 4.22–5.81)
RDW: 14.3 % (ref 11.5–15.5)
RDW: 14.2 % (ref 11.5–15.5)
WBC: 8.4 10*3/uL (ref 4.0–10.5)
WBC: 8.3 10*3/uL (ref 4.0–10.5)

## 2013-10-07 LAB — BASIC METABOLIC PANEL
BUN: 10 mg/dL (ref 6–23)
CO2: 27 mEq/L (ref 19–32)
Calcium: 8.7 mg/dL (ref 8.4–10.5)
Chloride: 103 mEq/L (ref 96–112)
Creatinine, Ser: 1.11 mg/dL (ref 0.50–1.35)
Glucose, Bld: 97 mg/dL (ref 70–99)
POTASSIUM: 3.5 meq/L — AB (ref 3.7–5.3)
Sodium: 139 mEq/L (ref 137–147)

## 2013-10-07 MED ORDER — SODIUM CHLORIDE 0.9 % IV SOLN
INTRAVENOUS | Status: DC
  Administered 2013-10-07 – 2013-10-08 (×2): via INTRAVENOUS

## 2013-10-07 NOTE — Progress Notes (Signed)
Subjective: Mild amount of hematochezia and melena at 7 PM last evening.  Objective: Vital signs in last 24 hours: Temp:  [97.7 F (36.5 C)-98.6 F (37 C)] 98.6 F (37 C) (02/26 1357) Pulse Rate:  [76-102] 78 (02/26 1357) Resp:  [16] 16 (02/26 1357) BP: (117-120)/(53-59) 118/53 mmHg (02/26 1357) SpO2:  [100 %] 100 % (02/26 1357) Last BM Date: 10/06/13  Intake/Output from previous day: 02/25 0701 - 02/26 0700 In: -  Out: 1 [Stool:1] Intake/Output this shift:    General appearance: alert and no distress GI: soft, non-tender; bowel sounds normal; no masses,  no organomegaly  Lab Results:  Recent Labs  10/04/13 2207 10/05/13 0613  10/06/13 0320 10/06/13 0916 10/07/13 0438  WBC 10.4 14.1*  --   --   --  8.4  HGB 11.7* 10.2*  < > 9.0* 9.5* 8.1*  HCT 35.6* 30.0*  < > 26.7* 27.7* 24.3*  PLT 256 225  --   --   --  201  < > = values in this interval not displayed. BMET  Recent Labs  10/04/13 2207 10/05/13 0613 10/07/13 0742  NA 137 139 139  K 4.1 4.2 3.5*  CL 102 104 103  CO2 23 24 27   GLUCOSE 134* 112* 97  BUN 26* 27* 10  CREATININE 1.10 1.09 1.11  CALCIUM 9.1 8.4 8.7   LFT  Recent Labs  10/05/13 0613  PROT 5.7*  ALBUMIN 3.2*  AST 18  ALT 13  ALKPHOS 42  BILITOT 0.4   PT/INR No results found for this basename: LABPROT, INR,  in the last 72 hours Hepatitis Panel No results found for this basename: HEPBSAG, HCVAB, HEPAIGM, HEPBIGM,  in the last 72 hours C-Diff No results found for this basename: CDIFFTOX,  in the last 72 hours Fecal Lactopherrin No results found for this basename: FECLLACTOFRN,  in the last 72 hours  Studies/Results: No results found.  Medications:  Scheduled: . [START ON 10/08/2013] pantoprazole (PROTONIX) IV  40 mg Intravenous Q12H  . sodium chloride  3 mL Intravenous Q12H  . sucralfate  1 g Oral TID WC & HS  . valACYclovir  250 mg Oral Daily   Continuous: . sodium chloride Stopped (10/05/13 1445)    Assessment/Plan: 1)  Duodenal ulcer with visible vessel.   He is H. Pylori negative.  I am concerned about the hematochezia and melena, even though it was very minor.  Coupled with the anemia I am concerned that he may be rebleeding.  He only had one episode.  If his HGB continues to drop with overt signs of bleeding a repeat EGD will be pursued.  His procedure will be performed in the OR as it was difficult to sedate him.  Plan: 1) Follow HGB. 2) Change to clear liquid diet. 3) NPO after midnight in case I need to perform an EGD.   LOS: 3 days   Jhane Lorio D 10/07/2013, 2:04 PM

## 2013-10-07 NOTE — Progress Notes (Signed)
[facility] PROGRESS NOTE  [name] ShortsGeorge Phillips ZOX:[id number] DOB: Jan 19, 1991 DOA: 10/04/2013 PCP: No primary provider on file.  Assessment/Plan: Acute Upper GI Bleed/PUD -EGD with bleeding duodenal ulcer. -Another episode of hematochezia last pm. -Hb has dropped to 8.1. -Continue PPI BID.  ABLA -Hb down to 8.1 today. -Follow and transfuse as necessary for Hb <7. -Recheck Hb this pm.  Code Status: Full code Family Communication: Girlfriend at bedside updated on plan of care. Disposition Plan: Home when ready.   Consultants:  GI   Antibiotics:  None   Subjective: Episode of BRBPR last pm.  Objective: Filed Vitals:   10/06/13 1046 10/06/13 1339 10/06/13 2119 10/07/13 0500  BP: 142/58 145/66 120/59 117/59  Pulse: 91 111 102 76  Temp: 98 F (36.7 C) 98.5 F (36.9 C) 98.2 F (36.8 C) 97.7 F (36.5 C)  TempSrc: Oral Oral Oral Oral  Resp: 18 16 16 16   Height:      Weight:      SpO2: 100% 100% 100% 100%    Intake/Output Summary (Last 24 hours) at 10/07/13 1034 Last data filed at 10/06/13 2120  Gross per 24 hour  Intake      0 ml  Output      1 ml  Net     -1 ml   Filed Weights   10/05/13 1527  Weight: 102.059 kg (225 lb)    Exam:   General:  AA Ox3, NAD  Cardiovascular: RRR  Respiratory: CTA B  Abdomen: S/NT/ND/+BS  Extremities: no C/C/E   Neurologic:  Intact, non-focal.  Data Reviewed: Basic Metabolic Panel:  Recent Labs Lab 10/04/13 2207 10/05/13 0613 10/07/13 0742  NA 137 139 139  K 4.1 4.2 3.5*  CL 102 104 103  CO2 23 24 27   GLUCOSE 134* 112* 97  BUN 26* 27* 10  CREATININE 1.10 1.09 1.11  CALCIUM 9.1 8.4 8.7  MG  --  1.7  --   PHOS  --  2.5  --    Liver Function Tests:  Recent Labs Lab 10/05/13 0613  AST 18  ALT 13  ALKPHOS 42  BILITOT 0.4  PROT 5.7*  ALBUMIN 3.2*   No results found for this basename: LIPASE, AMYLASE,  in the last 168 hours No results found for this basename: AMMONIA,  in the last 168  hours CBC:  Recent Labs Lab 10/04/13 2207 10/05/13 0613  10/05/13 1735 10/05/13 2133 10/06/13 0320 10/06/13 0916 10/07/13 0438  WBC 10.4 14.1*  --   --   --   --   --  8.4  NEUTROABS 8.2*  --   --   --   --   --   --   --   HGB 11.7* 10.2*  < > 9.4* 9.2* 9.0* 9.5* 8.1*  HCT 35.6* 30.0*  < > 27.6* 27.4* 26.7* 27.7* 24.3*  MCV 80.0 79.6  --   --   --   --   --  80.5  PLT 256 225  --   --   --   --   --  201  < > = values in this interval not displayed. Cardiac Enzymes: No results found for this basename: CKTOTAL, CKMB, CKMBINDEX, TROPONINI,  in the last 168 hours BNP (last 3 results) No results found for this basename: PROBNP,  in the last 8760 hours CBG: No results found for this basename: GLUCAP,  in the last 168 hours  No results found for this or any previous visit (from the past  240 hour(s)).   Studies: No results found.  Scheduled Meds: . [START ON 10/08/2013] pantoprazole (PROTONIX) IV  40 mg Intravenous Q12H  . sodium chloride  3 mL Intravenous Q12H  . sucralfate  1 g Oral TID WC & HS  . valACYclovir  250 mg Oral Daily   Continuous Infusions: . sodium chloride Stopped (10/05/13 1445)    Principal Problem:   GI bleed Active Problems:   PUD (peptic ulcer disease)    Time spent: 35 minutes.  Greater than 50% of this time was spent in direct contact with the patient coordinating care.    Chaya Jan  [facility] Pager [phone]  If 7PM-7AM, please contact night-coverage at [email or web address], password [facility] 10/07/2013, 10:34 AM  LOS: 3 days

## 2013-10-08 DIAGNOSIS — D62 Acute posthemorrhagic anemia: Secondary | ICD-10-CM

## 2013-10-08 LAB — CBC
HCT: 23.6 % — ABNORMAL LOW (ref 39.0–52.0)
HEMATOCRIT: 25.2 % — AB (ref 39.0–52.0)
HEMOGLOBIN: 8.5 g/dL — AB (ref 13.0–17.0)
Hemoglobin: 8.1 g/dL — ABNORMAL LOW (ref 13.0–17.0)
MCH: 27.6 pg (ref 26.0–34.0)
MCH: 27.3 pg (ref 26.0–34.0)
MCHC: 34.3 g/dL (ref 30.0–36.0)
MCHC: 33.7 g/dL (ref 30.0–36.0)
MCV: 80.5 fL (ref 78.0–100.0)
MCV: 81 fL (ref 78.0–100.0)
Platelets: 229 10*3/uL (ref 150–400)
Platelets: 254 10*3/uL (ref 150–400)
RBC: 2.93 MIL/uL — AB (ref 4.22–5.81)
RBC: 3.11 MIL/uL — ABNORMAL LOW (ref 4.22–5.81)
RDW: 14.4 % (ref 11.5–15.5)
RDW: 14.7 % (ref 11.5–15.5)
WBC: 7.7 10*3/uL (ref 4.0–10.5)
WBC: 7.6 10*3/uL (ref 4.0–10.5)

## 2013-10-08 MED ORDER — PANTOPRAZOLE SODIUM 40 MG PO TBEC: 40.0000 mg | DELAYED_RELEASE_TABLET | Freq: Every day | ORAL | Status: DC

## 2013-10-08 NOTE — Discharge Summary (Signed)
Physician Discharge Summary  [name] ShortsGeorge Gartman ZOX:[id number] DOB: 02/15/1991 DOA: 10/04/2013  PCP: No primary provider on file.  Admit date: 10/04/2013 Discharge date: 10/08/2013  Time spent: 45 minutes  Recommendations for Outpatient Follow-up:  -Will be discharged home today. -Advised to follow up with PCP in 2 weeks for a Hb recheck.   Discharge Diagnoses:  Principal Problem:   GI bleed Active Problems:   PUD (peptic ulcer disease)   Acute blood loss anemia   Discharge Condition: Stable and improved  Filed Weights   10/05/13 1527  Weight: 102.059 kg (225 lb)    History of present illness:  [name] Phillips is a 23 y.o. male  has a past medical history of PUD (peptic ulcer disease) and Herpes.  Presented with  24 hour hx of black stool but progressed to bright red blood. Last BM 3 hours ago. Reports some lower abdominal discomfort. Denies recent NSAID use. Denies epigastric pain. Initially was noted to be tachycardic up to 120's after IV improved. Hg 11.7. He [facility] showing [facility].  ER spoke with Dr. Loreta AveMann who is aware of the patient. [facility]list called for admission.    Hospital Course:   Acute Upper GI Bleed/PUD  -EGD with bleeding duodenal ulcer.  -Hb has remained stable around 8.5. -Protonix 40 mg daily. -GI agrees with DC home today.  ABLA  -Has not been transfused this admission. -Hb has been stable around 8.6. -Secondary to upper GI bleed from duodenal ulcer.  Procedures: EGD 2/24: IMPRESSION:  1) Bleeding duodenal ulcer.  2) Duodenitis.   Consultations:  GI, Dr. Elnoria HowardHung  Discharge Instructions  Discharge Orders   Future Orders Complete By Expires   Discontinue IV  As directed    Increase activity slowly  As directed        Medication List         pantoprazole 40 MG tablet  Commonly known as:  PROTONIX  Take 1 tablet (40 mg total) by mouth daily.     valACYclovir 500 MG tablet  Commonly known as:   VALTREX  Take 250 mg by mouth daily. Take 1/2 tablet       No Known Allergies     Follow-up Information   Schedule an appointment as soon as possible for a visit in 2 weeks to follow up. (With your regular provider)        The results of significant diagnostics from this hospitalization (including imaging, microbiology, ancillary and laboratory) are listed below for reference.    Significant Diagnostic Studies: Dg Chest 2 View  10/05/2013   CLINICAL DATA:  23 year old male with pain and nausea.  EXAM: CHEST  2 VIEW  COMPARISON:  None  FINDINGS: The cardiomediastinal silhouette is unremarkable.  The lungs are clear.  There is no evidence of focal airspace disease, pulmonary edema, suspicious pulmonary nodule/mass, pleural effusion, or pneumothorax. No acute bony abnormalities are identified.  IMPRESSION: No active cardiopulmonary disease.   Electronically Signed   By: Laveda AbbeJeff  Hu M.D.   On: 10/05/2013 03:00    Microbiology: No results found for this or any previous visit (from the past 240 hour(s)).   Labs: Basic Metabolic Panel:  Recent Labs Lab 10/04/13 2207 10/05/13 0613 10/07/13 0742  NA 137 139 139  K 4.1 4.2 3.5*  CL 102 104 103  CO2 23 24 27   GLUCOSE 134* 112* 97  BUN 26* 27* 10  CREATININE 1.10 1.09 1.11  CALCIUM 9.1 8.4 8.7  MG  --  1.7  --   PHOS  --  2.5  --    Liver Function Tests:  Recent Labs Lab 10/05/13 0613  AST 18  ALT 13  ALKPHOS 42  BILITOT 0.4  PROT 5.7*  ALBUMIN 3.2*   No results found for this basename: LIPASE, AMYLASE,  in the last 168 hours No results found for this basename: AMMONIA,  in the last 168 hours CBC:  Recent Labs Lab 10/04/13 2207 10/05/13 0613  10/06/13 0916 10/07/13 0438 10/07/13 1545 10/08/13 0353 10/08/13 1445  WBC 10.4 14.1*  --   --  8.4 8.3 7.7 7.6  NEUTROABS 8.2*  --   --   --   --   --   --   --   HGB 11.7* 10.2*  < > 9.5* 8.1* 8.6* 8.1* 8.5*  HCT 35.6* 30.0*  < > 27.7* 24.3* 25.3* 23.6* 25.2*  MCV 80.0  79.6  --   --  80.5 79.8 80.5 81.0  PLT 256 225  --   --  201 257 229 254  < > = values in this interval not displayed. Cardiac Enzymes: No results found for this basename: CKTOTAL, CKMB, CKMBINDEX, TROPONINI,  in the last 168 hours BNP: BNP (last 3 results) No results found for this basename: PROBNP,  in the last 8760 hours CBG: No results found for this basename: GLUCAP,  in the last 168 hours     Signed:  Chaya Jan  [facility] Pager: [phone] 10/08/2013, 5:02 PM

## 2013-10-08 NOTE — Progress Notes (Signed)
[facility] PROGRESS NOTE  Hewitt ShortsGeorge Matheny QMV:[id number] DOB: [birth year] DOA: 10/04/2013 PCP: No primary provider on file.  Assessment/Plan: Acute Upper GI Bleed/PUD -EGD with bleeding duodenal ulcer. -No further episodes of bleeding. -Hb has been stable overnight 8.1-8.6. -Continue PPI BID.  ABLA -Hb stable at 8.1. -Follow and transfuse as necessary for Hb <7. -Recheck Hb at 3 pm. If remains stable and no further bleeding, will DC this afternoon.  Code Status: Full code Family Communication: Patient only. Disposition Plan: Home when ready; likely later today.   Consultants:  GI   Antibiotics:  None   Subjective: No complaints/events.  Objective: Filed Vitals:   10/07/13 0500 10/07/13 1357 10/07/13 2050 10/08/13 0604  BP: 117/59 118/53 112/53 113/61  Pulse: 76 78 96 69  Temp: 97.7 F (36.5 C) 98.6 F (37 C) 97.9 F (36.6 C) 97.7 F (36.5 C)  TempSrc: Oral Oral Oral Oral  Resp: 16 16 18 18   Height:      Weight:      SpO2: 100% 100% 100% 100%   No intake or output data in the 24 hours ending 10/08/13 1012 Filed Weights   10/05/13 1527  Weight: 102.059 kg (225 lb)    Exam:   General:  AA Ox3, NAD  Cardiovascular: RRR  Respiratory: CTA B  Abdomen: S/NT/ND/+BS  Extremities: no C/C/E   Neurologic:  Intact, non-focal.  Data Reviewed: Basic Metabolic Panel:  Recent Labs Lab 10/04/13 2207 10/05/13 0613 10/07/13 0742  NA 137 139 139  K 4.1 4.2 3.5*  CL 102 104 103  CO2 23 24 27   GLUCOSE 134* 112* 97  BUN 26* 27* 10  CREATININE 1.10 1.09 1.11  CALCIUM 9.1 8.4 8.7  MG  --  1.7  --   PHOS  --  2.5  --    Liver Function Tests:  Recent Labs Lab 10/05/13 0613  AST 18  ALT 13  ALKPHOS 42  BILITOT 0.4  PROT 5.7*  ALBUMIN 3.2*   No results found for this basename: LIPASE, AMYLASE,  in the last 168 hours No results found for this basename: AMMONIA,  in the last 168 hours CBC:  Recent Labs Lab 10/04/13 2207 10/05/13 0613   10/06/13 0320 10/06/13 0916 10/07/13 0438 10/07/13 1545 10/08/13 0353  WBC 10.4 14.1*  --   --   --  8.4 8.3 7.7  NEUTROABS 8.2*  --   --   --   --   --   --   --   HGB 11.7* 10.2*  < > 9.0* 9.5* 8.1* 8.6* 8.1*  HCT 35.6* 30.0*  < > 26.7* 27.7* 24.3* 25.3* 23.6*  MCV 80.0 79.6  --   --   --  80.5 79.8 80.5  PLT 256 225  --   --   --  201 257 229  < > = values in this interval not displayed. Cardiac Enzymes: No results found for this basename: CKTOTAL, CKMB, CKMBINDEX, TROPONINI,  in the last 168 hours BNP (last 3 results) No results found for this basename: PROBNP,  in the last 8760 hours CBG: No results found for this basename: GLUCAP,  in the last 168 hours  No results found for this or any previous visit (from the past 240 hour(s)).   Studies: No results found.  Scheduled Meds: . pantoprazole (PROTONIX) IV  40 mg Intravenous Q12H  . sodium chloride  3 mL Intravenous Q12H  . sucralfate  1 g Oral TID WC & HS  . valACYclovir  250 mg Oral Daily   Continuous Infusions: . sodium chloride 100 mL/hr at 10/08/13 2130    Principal Problem:   GI bleed Active Problems:   PUD (peptic ulcer disease)   Acute blood loss anemia    Time spent: 25 minutes.  Greater than 50% of this time was spent in direct contact with the patient coordinating care.    Chaya Jan  [facility] Pager [phone]  If 7PM-7AM, please contact night-coverage at [email or web address], password [facility] 10/08/2013, 10:12 AM  LOS: 4 days

## 2013-10-08 NOTE — Progress Notes (Signed)
Subjective: No acute events.  No further bloody bowel movements.  Objective: Vital signs in last 24 hours: Temp:  [97.7 F (36.5 C)-98.6 F (37 C)] 97.7 F (36.5 C) (02/27 0604) Pulse Rate:  [69-96] 69 (02/27 0604) Resp:  [16-18] 18 (02/27 0604) BP: (112-118)/(53-61) 113/61 mmHg (02/27 0604) SpO2:  [100 %] 100 % (02/27 0604) Last BM Date: 10/06/13  Intake/Output from previous day:   Intake/Output this shift:    General appearance: alert and no distress GI: soft, non-tender; bowel sounds normal; no masses,  no organomegaly  Lab Results:  Recent Labs  10/07/13 0438 10/07/13 1545 10/08/13 0353  WBC 8.4 8.3 7.7  HGB 8.1* 8.6* 8.1*  HCT 24.3* 25.3* 23.6*  PLT 201 257 229   BMET  Recent Labs  10/07/13 0742  NA 139  K 3.5*  CL 103  CO2 27  GLUCOSE 97  BUN 10  CREATININE 1.11  CALCIUM 8.7   LFT No results found for this basename: PROT, ALBUMIN, AST, ALT, ALKPHOS, BILITOT, BILIDIR, IBILI,  in the last 72 hours PT/INR No results found for this basename: LABPROT, INR,  in the last 72 hours Hepatitis Panel No results found for this basename: HEPBSAG, HCVAB, HEPAIGM, HEPBIGM,  in the last 72 hours C-Diff No results found for this basename: CDIFFTOX,  in the last 72 hours Fecal Lactopherrin No results found for this basename: FECLLACTOFRN,  in the last 72 hours  Studies/Results: No results found.  Medications:  Scheduled: . pantoprazole (PROTONIX) IV  40 mg Intravenous Q12H  . sodium chloride  3 mL Intravenous Q12H  . sucralfate  1 g Oral TID WC & HS  . valACYclovir  250 mg Oral Daily   Continuous: . sodium chloride 100 mL/hr at 10/07/13 2311    Assessment/Plan: 1) Duodenal ulcer with bleed.   He is clinically stable.  No complaints at this time.  His HGB is stable in the  8 range with minor fluctuations.  I think it is safe for him to be discharged.  Plan: 1) Okay to D/C home. 2) Follow up in the office in 1-2 weeks. 3) PPI BID x 1 month and  sucralfate 1 TID x 1 month then PPI QD.   LOS: 4 days   Michaelene Dutan D 10/08/2013, 9:06 AM

## 2018-09-14 ENCOUNTER — Ambulatory Visit: Payer: BC Managed Care – PPO | Admitting: Primary Care

## 2018-09-14 ENCOUNTER — Encounter: Payer: Self-pay | Admitting: Primary Care

## 2018-09-14 VITALS — BP 134/86 | HR 70 | Temp 98.0°F | Ht 72.75 in | Wt 241.8 lb

## 2018-09-14 DIAGNOSIS — K219 Gastro-esophageal reflux disease without esophagitis: Secondary | ICD-10-CM

## 2018-09-14 DIAGNOSIS — Z23 Encounter for immunization: Secondary | ICD-10-CM | POA: Diagnosis not present

## 2018-09-14 DIAGNOSIS — A6000 Herpesviral infection of urogenital system, unspecified: Secondary | ICD-10-CM

## 2018-09-14 MED ORDER — VALACYCLOVIR HCL 500 MG PO TABS: ORAL_TABLET | ORAL | 1 refills | Status: AC

## 2018-09-14 NOTE — Assessment & Plan Note (Signed)
Infrequent outbreaks. Rx for Valtrex provide to use PRN. Monitor outbreaks.

## 2018-09-14 NOTE — Progress Notes (Signed)
Subjective:    Patient ID: [name] Phillips, male    DOB: 01/11/1991, 28 y.o.   MRN: [id number]  HPI  [name] Phillips is a 28 year old male who presents today to establish care and discuss the problems mentioned below. Will obtain old records.  1) GERD/Peptic Ulcer Disease: Diagnosed years ago. History of GI bleeding twice, initially from Ibuprofen use, then second occurrence due to stress. Previously  managed on pantoprazole 40 mg for which he has not taken in about one year.   He does have intermittent epigastric pain, esophageal burning with his last episode being late last week. Overall infrequent episodes. In the past he's taken Tums and Prilosec with improvement. He denies bloody in the stools, nausea, vomiting, other abdominal pain.   2) Genital Herpes: Diagnosed several years ago. No recent outbreaks, previously managed on Valtrex. Endorses one small outbreak within the last year. He does not have a current prescription.   BP Readings from Last 3 Encounters:  09/14/18 134/86  10/08/13 (!) 125/59     Review of Systems  Constitutional: Negative for fever.  Respiratory: Negative for shortness of breath.   Cardiovascular: Negative for chest pain.  Gastrointestinal: Negative for blood in stool.       Esophageal burning, epigastric discomfort  Neurological: Negative for dizziness and headaches.       Past Medical History:  Diagnosis Date  . Herpes   . PUD (peptic ulcer disease)      Social History   Socioeconomic History  . Marital status: Single    Spouse name: Not on file  . Number of children: Not on file  . Years of education: Not on file  . Highest education level: Not on file  Occupational History  . Not on file  Social Needs  . Financial resource strain: Not on file  . Food insecurity:    Worry: Not on file    Inability: Not on file  . Transportation needs:    Medical: Not on file    Non-medical: Not on file  Tobacco Use  . Smoking status: Never Smoker    . Smokeless tobacco: Never Used  Substance and Sexual Activity  . Alcohol use: Not Currently    Comment: rarely   . Drug use: No  . Sexual activity: Never  Lifestyle  . Physical activity:    Days per week: Not on file    Minutes per session: Not on file  . Stress: Not on file  Relationships  . Social connections:    Talks on phone: Not on file    Gets together: Not on file    Attends religious service: Not on file    Active member of club or organization: Not on file    Attends meetings of clubs or organizations: Not on file    Relationship status: Not on file  . Intimate partner violence:    Fear of current or ex partner: Not on file    Emotionally abused: Not on file    Physically abused: Not on file    Forced sexual activity: Not on file  Other Topics Concern  . Not on file  Social History Narrative   Single.   No children.   Works in [employer].   Enjoys reading, playing video games, exercise.    Past Surgical History:  Procedure Laterality Date  . ESOPHAGOGASTRODUODENOSCOPY N/A 10/05/2013   Procedure: ESOPHAGOGASTRODUODENOSCOPY (EGD);  Surgeon: Theda BelfastPatrick D Hung, MD;  [facility];  Service: Endoscopy;  Laterality: N/A;  .  ESOPHAGOGASTRODUODENOSCOPY ENDOSCOPY    . WISDOM TOOTH EXTRACTION      Family History  Problem Relation Age of Onset  . CAD Mother   . Diabetes Mellitus II Mother   . Stroke Mother   . Prostate cancer Father   . Hypertension Father   . Asthma Brother     No Known Allergies  Current Outpatient Medications on File Prior to Visit  Medication Sig Dispense Refill  . valACYclovir (VALTREX) 500 MG tablet Take 500 mg by mouth daily.      No current facility-administered medications on file prior to visit.     BP 134/86   Pulse 70   Temp 98 F (36.7 C) (Oral)   Ht 6' 0.75" (1.848 m)   Wt 241 lb 12 oz (109.7 kg)   SpO2 98%   BMI 32.11 kg/m    Objective:   Physical Exam  Constitutional: He appears well-nourished.  Neck:  Neck supple.  Cardiovascular: Normal rate and regular rhythm.  Respiratory: Effort normal and breath sounds normal.  Skin: Skin is warm and dry.  Psychiatric: He has a normal mood and affect.           Assessment & Plan:

## 2018-09-14 NOTE — Patient Instructions (Signed)
If you have recurrent heartburn then start omeprazole (Prilosec) 20 mg once daily for 4-6 weeks.  I sent in a prescription for valacyclovir 500 mg tablets for you to use as needed. Take 1 tablet twice daily for 3 days as needed for outbreaks.  It was a pleasure to meet you today! Please don't hesitate to call or message me with any questions. Welcome to [facility]!

## 2018-09-14 NOTE — Assessment & Plan Note (Signed)
Intermittent, also with history of PUD and GI bleed. Discussed to avoid recurrent use of NSAID's.  Also discussed use of omeprazole for recurrent GERD.  He will update if symptoms persist. Handout provided regarding triggers.

## 2018-09-14 NOTE — Addendum Note (Signed)
Addended by: Tawnya Crook on: 09/14/2018 11:08 AM   Modules accepted: Orders

## 2018-09-15 ENCOUNTER — Telehealth: Payer: Self-pay | Admitting: *Deleted

## 2018-09-15 DIAGNOSIS — K219 Gastro-esophageal reflux disease without esophagitis: Secondary | ICD-10-CM

## 2018-09-15 MED ORDER — OMEPRAZOLE 20 MG PO CPDR: 20.0000 mg | DELAYED_RELEASE_CAPSULE | Freq: Every day | ORAL | 0 refills | Status: DC

## 2018-09-15 NOTE — Telephone Encounter (Signed)
Spoke to pt who states he was advised to take prilosec OTC. Pt is requesting a Rx be sent to [facility] so that he is able to use his benefits card. pls advise

## 2018-09-15 NOTE — Telephone Encounter (Signed)
Noted, Rx sent to pharmacy. 

## 2019-03-30 ENCOUNTER — Other Ambulatory Visit: Payer: Self-pay

## 2019-03-30 ENCOUNTER — Other Ambulatory Visit (INDEPENDENT_AMBULATORY_CARE_PROVIDER_SITE_OTHER): Payer: BC Managed Care – PPO

## 2019-03-30 ENCOUNTER — Ambulatory Visit: Payer: BC Managed Care – PPO | Admitting: Primary Care

## 2019-03-30 ENCOUNTER — Encounter: Payer: Self-pay | Admitting: Primary Care

## 2019-03-30 DIAGNOSIS — K219 Gastro-esophageal reflux disease without esophagitis: Secondary | ICD-10-CM

## 2019-03-30 DIAGNOSIS — R109 Unspecified abdominal pain: Secondary | ICD-10-CM | POA: Insufficient documentation

## 2019-03-30 DIAGNOSIS — R7309 Other abnormal glucose: Secondary | ICD-10-CM

## 2019-03-30 DIAGNOSIS — R1010 Upper abdominal pain, unspecified: Secondary | ICD-10-CM | POA: Diagnosis not present

## 2019-03-30 LAB — LIPASE: Lipase: 34 U/L (ref 11.0–59.0)

## 2019-03-30 LAB — CBC WITH DIFFERENTIAL/PLATELET
Basophils Absolute: 0 10*3/uL (ref 0.0–0.1)
Basophils Relative: 0.5 % (ref 0.0–3.0)
Eosinophils Absolute: 0.5 10*3/uL (ref 0.0–0.7)
Eosinophils Relative: 5.2 % — ABNORMAL HIGH (ref 0.0–5.0)
HCT: 39.8 % (ref 39.0–52.0)
Hemoglobin: 12.5 g/dL — ABNORMAL LOW (ref 13.0–17.0)
Lymphocytes Relative: 19.5 % (ref 12.0–46.0)
Lymphs Abs: 1.8 10*3/uL (ref 0.7–4.0)
MCHC: 31.5 g/dL (ref 30.0–36.0)
MCV: 73.5 fl — ABNORMAL LOW (ref 78.0–100.0)
Monocytes Absolute: 0.7 10*3/uL (ref 0.1–1.0)
Monocytes Relative: 7.3 % (ref 3.0–12.0)
Neutro Abs: 6.3 10*3/uL (ref 1.4–7.7)
Neutrophils Relative %: 67.5 % (ref 43.0–77.0)
Platelets: 262 10*3/uL (ref 150.0–400.0)
RBC: 5.41 Mil/uL (ref 4.22–5.81)
RDW: 17.8 % — ABNORMAL HIGH (ref 11.5–15.5)
WBC: 9.3 10*3/uL (ref 4.0–10.5)

## 2019-03-30 LAB — COMPREHENSIVE METABOLIC PANEL
ALT: 21 U/L (ref 0–53)
AST: 29 U/L (ref 0–37)
Albumin: 4.3 g/dL (ref 3.5–5.2)
Alkaline Phosphatase: 67 U/L (ref 39–117)
BUN: 13 mg/dL (ref 6–23)
CO2: 30 mEq/L (ref 19–32)
Calcium: 9.5 mg/dL (ref 8.4–10.5)
Chloride: 103 mEq/L (ref 96–112)
Creatinine, Ser: 1.25 mg/dL (ref 0.40–1.50)
GFR: 81.98 mL/min (ref >60.00)
Glucose, Bld: 126 mg/dL — ABNORMAL HIGH (ref 70–99)
Potassium: 4 mEq/L (ref 3.5–5.1)
Sodium: 137 mEq/L (ref 135–145)
Total Bilirubin: 0.3 mg/dL (ref 0.2–1.2)
Total Protein: 7.4 g/dL (ref 6.0–8.3)

## 2019-03-30 LAB — HEMOGLOBIN A1C: Hgb A1c MFr Bld: 5.5 % (ref 4.6–6.5)

## 2019-03-30 MED ORDER — OMEPRAZOLE 40 MG PO CPDR: 40.0000 mg | DELAYED_RELEASE_CAPSULE | Freq: Every day | ORAL | 0 refills | Status: DC

## 2019-03-30 NOTE — Progress Notes (Signed)
Subjective:    Patient ID: [name] Phillips, male    DOB: [birth year], 28 y.o.   MRN: [id number]  HPI  Mr. Blubaugh is a 28 year old male with a history of GERD, PUD, and GI bleed from NSAID use who presents today with a chief complaint of abdominal discomfort.  His discomfort is located to the epigastric region which has been chronic over the years. He was last evaluated for this in February 2020, endorsed infrequent symptoms that improved with Tums or Prilosec.   Since his last visit he continues to experience infrequent "heart burn" that will improve with omeprazole. Over the last several weeks he's noticed increased epigastric bloating, intermittent sharp/gas pain. He's been taking omeprazole daily for the last 2 weeks, also some OTC gas medication with some improvement but without resolve.  Two days ago he noticed increased epigastric pain (some radiation to bilateral upper abdomen) and also mid thoracic back pain. Felt "bublles" radiating up into the chest with belching. He applied a heating pad which helped with discomfort. His diet has changed as he's been eating more healthy/fresh meals through [employer]. Also with some increased stress with work.   He denies nausea, vomiting, fevers, bloody stools, fatigue. Last bowel movement was 5 days ago. Typically has bowel movements are daily. Has not taken anything for constipation. His symptoms are not aggravated with regards to meals.   Review of Systems  Constitutional: Negative for fever.  Gastrointestinal: Positive for abdominal pain and constipation. Negative for diarrhea.       Belching, esophageal burning  Psychiatric/Behavioral:       Increased stress       Past Medical History:  Diagnosis Date  . Acute blood loss anemia 10/07/2013  . GI bleed 10/05/2013  . Herpes   . PUD (peptic ulcer disease)      Social History   Socioeconomic History  . Marital status: Single    Spouse name: Not on file  . Number of children: Not on  file  . Years of education: Not on file  . Highest education level: Not on file  Occupational History  . Not on file  Social Needs  . Financial resource strain: Not on file  . Food insecurity    Worry: Not on file    Inability: Not on file  . Transportation needs    Medical: Not on file    Non-medical: Not on file  Tobacco Use  . Smoking status: Never Smoker  . Smokeless tobacco: Never Used  Substance and Sexual Activity  . Alcohol use: Not Currently    Comment: rarely   . Drug use: No  . Sexual activity: Never  Lifestyle  . Physical activity    Days per week: Not on file    Minutes per session: Not on file  . Stress: Not on file  Relationships  . Social Herbalist on phone: Not on file    Gets together: Not on file    Attends religious service: Not on file    Active member of club or organization: Not on file    Attends meetings of clubs or organizations: Not on file    Relationship status: Not on file  . Intimate partner violence    Fear of current or ex partner: Not on file    Emotionally abused: Not on file    Physically abused: Not on file    Forced sexual activity: Not on file  Other Topics Concern  .  Not on file  Social History Narrative   Single.   No children.   Works in [employer].   Enjoys reading, playing video games, exercise.    Past Surgical History:  Procedure Laterality Date  . ESOPHAGOGASTRODUODENOSCOPY N/A 10/05/2013   Procedure: ESOPHAGOGASTRODUODENOSCOPY (EGD);  Surgeon: Theda BelfastPatrick D Hung, MD;  [facility];  Service: Endoscopy;  Laterality: N/A;  . ESOPHAGOGASTRODUODENOSCOPY ENDOSCOPY    . WISDOM TOOTH EXTRACTION      Family History  Problem Relation Age of Onset  . CAD Mother   . Diabetes Mellitus II Mother   . Stroke Mother   . Prostate cancer Father   . Hypertension Father   . Asthma Brother     No Known Allergies  Current Outpatient Medications on File Prior to Visit  Medication Sig Dispense Refill  .  omeprazole (PRILOSEC) 20 MG capsule Take 1 capsule (20 mg total) by mouth daily. For heartburn. 90 capsule 0  . valACYclovir (VALTREX) 500 MG tablet Take 1 tablet by mouth twice daily for three days as needed for outbreaks. 6 tablet 1   No current facility-administered medications on file prior to visit.     BP 136/86   Pulse 78   Temp 97.8 F (36.6 C) (Temporal)   Ht 6' 0.75" (1.848 m)   Wt 235 lb 8 oz (106.8 kg)   SpO2 98%   BMI 31.28 kg/m    Objective:   Physical Exam  Constitutional: He appears well-nourished.  Neck: Neck supple.  Cardiovascular: Normal rate and regular rhythm.  Respiratory: Effort normal and breath sounds normal.  GI: Soft. Bowel sounds are normal. There is no abdominal tenderness. There is negative Murphy's sign.  Skin: Skin is warm and dry.           Assessment & Plan:

## 2019-03-30 NOTE — Assessment & Plan Note (Signed)
Epigastric pain with bilateral upper abdomen radiation. Also with GERD symptoms. Exam today stable, no distress.  Check labs today to rule out other causes including Lipase, CMP, CBC. Ultrasound of the abdomen pending for gall bladder/pancrease evaluation.  Will increase omeprazole to 40 mg daily for one month. Also discussed Miralax daily for 3-7 days for constipation.

## 2019-03-30 NOTE — Patient Instructions (Signed)
Start omeprazole 40 mg once daily for heartburn.  You will be contacted regarding your ultrasound of the abdomen.  Please let us know if you have not been contacted within one week.   Stop by the lab prior to leaving today. I will notify you of your results once received.   It was a pleasure to see you today!   Food Choices for Gastroesophageal Reflux Disease, Adult When you have gastroesophageal reflux disease (GERD), the foods you eat and your eating habits are very important. Choosing the right foods can help ease your discomfort. Think about working with a nutrition specialist (dietitian) to help you make good choices. What are tips for following this plan?  Meals  Choose healthy foods that are low in fat, such as fruits, vegetables, whole grains, low-fat dairy products, and lean meat, fish, and poultry.  Eat small meals often instead of 3 large meals a day. Eat your meals slowly, and in a place where you are relaxed. Avoid bending over or lying down until 2-3 hours after eating.  Avoid eating meals 2-3 hours before bed.  Avoid drinking a lot of liquid with meals.  Cook foods using methods other than frying. Bake, grill, or broil food instead.  Avoid or limit: ? Chocolate. ? Peppermint or spearmint. ? Alcohol. ? Pepper. ? Black and decaffeinated coffee. ? Black and decaffeinated tea. ? Bubbly (carbonated) soft drinks. ? Caffeinated energy drinks and soft drinks.  Limit high-fat foods such as: ? Fatty meat or fried foods. ? Whole milk, cream, butter, or ice cream. ? Nuts and nut butters. ? Pastries, donuts, and sweets made with butter or shortening.  Avoid foods that cause symptoms. These foods may be different for everyone. Common foods that cause symptoms include: ? Tomatoes. ? Oranges, lemons, and limes. ? Peppers. ? Spicy food. ? Onions and garlic. ? Vinegar. Lifestyle  Maintain a healthy weight. Ask your doctor what weight is healthy for you. If you need to  lose weight, work with your doctor to do so safely.  Exercise for at least 30 minutes for 5 or more days each week, or as told by your doctor.  Wear loose-fitting clothes.  Do not smoke. If you need help quitting, ask your doctor.  Sleep with the head of your bed higher than your feet. Use a wedge under the mattress or blocks under the bed frame to raise the head of the bed. Summary  When you have gastroesophageal reflux disease (GERD), food and lifestyle choices are very important in easing your symptoms.  Eat small meals often instead of 3 large meals a day. Eat your meals slowly, and in a place where you are relaxed.  Limit high-fat foods such as fatty meat or fried foods.  Avoid bending over or lying down until 2-3 hours after eating.  Avoid peppermint and spearmint, caffeine, alcohol, and chocolate. This information is not intended to replace advice given to you by your health care provider. Make sure you discuss any questions you have with your health care provider. Document Released: 01/28/2012 Document Revised: 11/19/2018 Document Reviewed: 09/03/2016 Elsevier Patient Education  2020 [employer].

## 2019-03-30 NOTE — Assessment & Plan Note (Signed)
Symptoms today sound similar to GERD, especially given location of pain with radiation of symptoms to chest.   Given increased symptoms we will check labs and an upper abdominal ultrasound to rule out other causes. Increase omeprazole to 40 mg daily. He will update.

## 2019-04-08 ENCOUNTER — Ambulatory Visit
Admission: RE | Admit: 2019-04-08 | Discharge: 2019-04-08 | Disposition: A | Discharge status: Home / Self Care | Payer: BC Managed Care – PPO | Source: Ambulatory Visit | Attending: Primary Care | Admitting: Primary Care

## 2019-04-08 DIAGNOSIS — R1010 Upper abdominal pain, unspecified: Secondary | ICD-10-CM

## 2019-04-15 ENCOUNTER — Telehealth: Payer: Self-pay | Admitting: Primary Care

## 2019-04-15 NOTE — Telephone Encounter (Signed)
Best number [phone]  Pt would like to get a copy of his last labs Please call when ready for pick up

## 2019-04-16 ENCOUNTER — Encounter: Payer: Self-pay | Admitting: *Deleted

## 2019-04-16 NOTE — Telephone Encounter (Signed)
Patient has been notified. Left in the front office.

## 2019-04-16 NOTE — Telephone Encounter (Signed)
Printed and send the copies through [place] as well.

## 2019-04-21 ENCOUNTER — Other Ambulatory Visit: Payer: Self-pay | Admitting: Primary Care

## 2019-04-21 DIAGNOSIS — K219 Gastro-esophageal reflux disease without esophagitis: Secondary | ICD-10-CM

## 2019-06-02 ENCOUNTER — Other Ambulatory Visit: Payer: Self-pay

## 2019-06-02 ENCOUNTER — Encounter: Payer: Self-pay | Admitting: Primary Care

## 2019-06-02 ENCOUNTER — Ambulatory Visit: Payer: BC Managed Care – PPO | Admitting: Primary Care

## 2019-06-02 DIAGNOSIS — K219 Gastro-esophageal reflux disease without esophagitis: Secondary | ICD-10-CM | POA: Diagnosis not present

## 2019-06-02 MED ORDER — OMEPRAZOLE 40 MG PO CPDR: 40.0000 mg | DELAYED_RELEASE_CAPSULE | Freq: Every day | ORAL | 0 refills | Status: DC

## 2019-06-02 NOTE — Patient Instructions (Signed)
Resume omeprazole 40 mg once daily for reflux.  Try to avoid triggers of reflux as discussed. Take a look at the handout below.  Please call me if your symptoms return after you complete the omeprazole 40 mg.   It was a pleasure to see you today!   Food Choices for Gastroesophageal Reflux Disease, Adult When you have gastroesophageal reflux disease (GERD), the foods you eat and your eating habits are very important. Choosing the right foods can help ease your discomfort. Think about working with a nutrition specialist (dietitian) to help you make good choices. What are tips for following this plan?  Meals  Choose healthy foods that are low in fat, such as fruits, vegetables, whole grains, low-fat dairy products, and lean meat, fish, and poultry.  Eat small meals often instead of 3 large meals a day. Eat your meals slowly, and in a place where you are relaxed. Avoid bending over or lying down until 2-3 hours after eating.  Avoid eating meals 2-3 hours before bed.  Avoid drinking a lot of liquid with meals.  Cook foods using methods other than frying. Bake, grill, or broil food instead.  Avoid or limit: ? Chocolate. ? Peppermint or spearmint. ? Alcohol. ? Pepper. ? Black and decaffeinated coffee. ? Black and decaffeinated tea. ? Bubbly (carbonated) soft drinks. ? Caffeinated energy drinks and soft drinks.  Limit high-fat foods such as: ? Fatty meat or fried foods. ? Whole milk, cream, butter, or ice cream. ? Nuts and nut butters. ? Pastries, donuts, and sweets made with butter or shortening.  Avoid foods that cause symptoms. These foods may be different for everyone. Common foods that cause symptoms include: ? Tomatoes. ? Oranges, lemons, and limes. ? Peppers. ? Spicy food. ? Onions and garlic. ? Vinegar. Lifestyle  Maintain a healthy weight. Ask your doctor what weight is healthy for you. If you need to lose weight, work with your doctor to do so safely.  Exercise  for at least 30 minutes for 5 or more days each week, or as told by your doctor.  Wear loose-fitting clothes.  Do not smoke. If you need help quitting, ask your doctor.  Sleep with the head of your bed higher than your feet. Use a wedge under the mattress or blocks under the bed frame to raise the head of the bed. Summary  When you have gastroesophageal reflux disease (GERD), food and lifestyle choices are very important in easing your symptoms.  Eat small meals often instead of 3 large meals a day. Eat your meals slowly, and in a place where you are relaxed.  Limit high-fat foods such as fatty meat or fried foods.  Avoid bending over or lying down until 2-3 hours after eating.  Avoid peppermint and spearmint, caffeine, alcohol, and chocolate. This information is not intended to replace advice given to you by your health care provider. Make sure you discuss any questions you have with your health care provider. Document Released: 01/28/2012 Document Revised: 11/19/2018 Document Reviewed: 09/03/2016 Elsevier Patient Education  2020 [employer].

## 2019-06-02 NOTE — Assessment & Plan Note (Signed)
Symptoms representative of uncontrolled reflux which basically resolve with high dose PPI.  Endoscopy from 2015 reviewed. CBC from August 2020 with mild anemia, will repeat.  Will resume omeprazole 40 mg daily for 3 months, he will update if symptoms return. Consider GI consult if chronic high dose PPI use is still required.

## 2019-06-02 NOTE — Progress Notes (Signed)
Subjective:    Patient ID: [name] Phillips, male    DOB: [birth year], 28 y.o.   MRN: [id number]  HPI  [name] Phillips is a 28 year old male with a history of GERD who presents today with a chief complaint of abdominal pain.  His pain is located to the substernal chest with sharp pains to the right lateral trunk and mid thoracic back. He describes his pain as "stabbing, burning, fullness" mainly to the chest.  He was managed on omeprazole 40 mg daily which helped to nearly resolve his symptoms. He ran out of his omeprazole four weeks ago and once he was off for a few days his symptoms returned.   He's taken Tums, Rolaids, and generic "antacid" with temporary improvement. He has tried to avoid spicy foods. He denies nausea, vomiting, diarrhea, constipation, diarrhea. He denies a family history of GERD.   He has undergone endoscopy twice, once in 2012 (mild bleeding) and again in 2015. Endoscopy from 2015 with bleeding duodenal ulcer from NSAID's and stress. He was symptomatic with GI bleeding and anemia at the time.   Review of Systems  Constitutional: Negative for fever and unexpected weight change.  Respiratory:       Denies hemoptysis   Gastrointestinal: Negative for abdominal pain, blood in stool, constipation, nausea and vomiting.       Esophageal burning, see HPI       Past Medical History:  Diagnosis Date  . Acute blood loss anemia 10/07/2013  . GI bleed 10/05/2013  . Herpes   . PUD (peptic ulcer disease)      Social History   Socioeconomic History  . Marital status: Single    Spouse name: Not on file  . Number of children: Not on file  . Years of education: Not on file  . Highest education level: Not on file  Occupational History  . Not on file  Social Needs  . Financial resource strain: Not on file  . Food insecurity    Worry: Not on file    Inability: Not on file  . Transportation needs    Medical: Not on file    Non-medical: Not on file  Tobacco Use  . Smoking  status: Never Smoker  . Smokeless tobacco: Never Used  Substance and Sexual Activity  . Alcohol use: Not Currently    Comment: rarely   . Drug use: No  . Sexual activity: Never  Lifestyle  . Physical activity    Days per week: Not on file    Minutes per session: Not on file  . Stress: Not on file  Relationships  . Social Herbalist on phone: Not on file    Gets together: Not on file    Attends religious service: Not on file    Active member of club or organization: Not on file    Attends meetings of clubs or organizations: Not on file    Relationship status: Not on file  . Intimate partner violence    Fear of current or ex partner: Not on file    Emotionally abused: Not on file    Physically abused: Not on file    Forced sexual activity: Not on file  Other Topics Concern  . Not on file  Social History Narrative   Single.   No children.   Works in [place].   Enjoys reading, playing video games, exercise.    Past Surgical History:  Procedure Laterality Date  . ESOPHAGOGASTRODUODENOSCOPY N/A 10/05/2013  Procedure: ESOPHAGOGASTRODUODENOSCOPY (EGD);  Surgeon: Theda Belfast, MD;  Location[facility];  Service: Endoscopy;  Laterality: N/A;  . ESOPHAGOGASTRODUODENOSCOPY ENDOSCOPY    . WISDOM TOOTH EXTRACTION      Family History  Problem Relation Age of Onset  . CAD Mother   . Diabetes Mellitus II Mother   . Stroke Mother   . Prostate cancer Father   . Hypertension Father   . Asthma Brother     No Known Allergies  Current Outpatient Medications on File Prior to Visit  Medication Sig Dispense Refill  . valACYclovir (VALTREX) 500 MG tablet Take 1 tablet by mouth twice daily for three days as needed for outbreaks. 6 tablet 1   No current facility-administered medications on file prior to visit.     BP 110/74 (BP Location: Left Arm, Patient Position: Sitting, Cuff Size: Large)   Pulse 95   Temp 98.7 F (37.1 C)   Ht 6' 0.75" (1.848 m)   Wt 228 lb  (103.4 kg)   SpO2 98%   BMI 30.29 kg/m    Objective:   Physical Exam  Constitutional: He appears well-nourished.  Neck: Neck supple.  Cardiovascular: Normal rate and regular rhythm.  Respiratory: Effort normal and breath sounds normal.  GI: Soft. Bowel sounds are normal. There is no abdominal tenderness.  Skin: Skin is warm and dry.           Assessment & Plan:

## 2019-07-25 ENCOUNTER — Other Ambulatory Visit: Payer: Self-pay | Admitting: Primary Care

## 2019-07-25 DIAGNOSIS — K219 Gastro-esophageal reflux disease without esophagitis: Secondary | ICD-10-CM

## 2019-08-25 ENCOUNTER — Other Ambulatory Visit: Payer: Self-pay | Admitting: Primary Care

## 2019-08-25 DIAGNOSIS — K219 Gastro-esophageal reflux disease without esophagitis: Secondary | ICD-10-CM

## 2019-11-11 ENCOUNTER — Ambulatory Visit: Payer: BC Managed Care – PPO | Attending: Family

## 2019-11-11 DIAGNOSIS — Z23 Encounter for immunization: Secondary | ICD-10-CM

## 2019-11-11 NOTE — Progress Notes (Signed)
   Covid-19 Vaccination Clinic  Name:  [name] Phillips    MRN: [id number] DOB: [birth year]  11/11/2019  [name] Phillips was observed post Covid-19 immunization for 15 minutes without incident. He was provided with Vaccine Information Sheet and instruction to access the V-Safe system.   [name] Phillips was instructed to call 911 with any severe reactions post vaccine: Marland Kitchen Difficulty breathing  . Swelling of face and throat  . A fast heartbeat  . A bad rash all over body  . Dizziness and weakness   Immunizations Administered    Name Date Dose VIS Date Route   Moderna COVID-19 Vaccine 11/11/2019 10:12 AM 0.5 mL 07/13/2019 Intramuscular   Manufacturer: Moderna   Lot: [id number]   NDC: 03500-938-18

## 2019-12-14 ENCOUNTER — Ambulatory Visit: Payer: BC Managed Care – PPO | Attending: Family

## 2019-12-14 ENCOUNTER — Ambulatory Visit: Payer: BC Managed Care – PPO

## 2019-12-14 DIAGNOSIS — Z23 Encounter for immunization: Secondary | ICD-10-CM

## 2019-12-14 NOTE — Progress Notes (Signed)
   Covid-19 Vaccination Clinic  Name:  [name] Phillips    MRN: [id number] DOB: October 09, 1990  12/14/2019  [name] was observed post Covid-19 immunization for 15 minutes without incident. He was provided with Vaccine Information Sheet and instruction to access the V-Safe system.   [name] was instructed to call 911 with any severe reactions post vaccine: Marland Kitchen Difficulty breathing  . Swelling of face and throat  . A fast heartbeat  . A bad rash all over body  . Dizziness and weakness   Immunizations Administered    Name Date Dose VIS Date Route   Moderna COVID-19 Vaccine 12/14/2019  4:24 PM 0.5 mL 07/2019 Intramuscular   Manufacturer: Moderna   Lot: [id number]   NDC: 27871-836-72

## 2020-04-25 ENCOUNTER — Telehealth: Payer: Self-pay

## 2020-04-25 NOTE — Telephone Encounter (Signed)
Noted, will evaluate. 

## 2020-04-25 NOTE — Telephone Encounter (Signed)
[facility] Day - Client TELEPHONE ADVICE RECORD AccessNurse Patient Name: [name] Phillips Gender: Male DOB: 11-Aug-1991 Age: 29 Y 2 M 22 D Return Phone Number: [phone] (Primary) Address: City/State/Zip: [place] [address] Client [facility] Day - Client Client Site [facility] - Day Physician Vernona Rieger - NP Contact Type Call Who Is Calling Patient / Member / Family / Caregiver Call Type Triage / Clinical Relationship To Patient Self Return Phone Number [phone] (Primary) Chief Complaint CHEST PAIN (>=21 years) - pain, pressure, heaviness or tightness Reason for Call Symptomatic / Request for Health Information Initial Comment Caller states PT is having stomach and chest pain. Translation No Nurse Assessment Nurse: Hermelinda Dellen, RN, Brandi Date/Time (Eastern Time): 04/25/2020 9:01:03 AM Confirm and document reason for call. If symptomatic, describe symptoms. ---Caller states PT is having stomach and chest pain. Has the patient had close contact with a person known or suspected to have the novel coronavirus illness OR traveled / lives in area with major community spread (including international travel) in the last 14 days from the onset of symptoms? * If Asymptomatic, screen for exposure and travel within the last 14 days. ---No Does the patient have any new or worsening symptoms? ---Yes Will a triage be completed? ---Yes Related visit to physician within the last 2 weeks? ---No Does the PT have any chronic conditions? (i.e. diabetes, asthma, this includes High risk factors for pregnancy, etc.) ---No Is this a behavioral health or substance abuse call? ---No Guidelines Guideline Title Affirmed Question Affirmed Notes Nurse Date/Time [name] Phillips Time) Chest Pain [1] Chest pain lasts > 5 minutes AND [2] age > 30 AND [3] one or more cardiac risk factors (e.g., diabetes, high blood pressure, high  cholesterol, smoker, or strong family history of heart disease) Hermelinda Dellen, RN, Brandi 04/25/2020 9:01:59 AMPLEASE NOTE: All timestamps contained within this report are represented as [place] Standard Time. CONFIDENTIALTY NOTICE: This fax transmission is intended only for the addressee. It contains information that is legally privileged, confidential or otherwise protected from use or disclosure. If you are not the intended recipient, you are strictly prohibited from reviewing, disclosing, copying using or disseminating any of this information or taking any action in reliance on or regarding this information. If you have received this fax in error, please notify us immediately by telephone so that we can arrange for its return to [place]. Phone: [phone], Toll-Free: [phone], Fax: [phone] Page: 2 of 2 Call Id: [id number] Disp. Time [name] Phillips Time) Disposition Final User 04/25/2020 8:59:46 AM Send to Urgent [name] Phillips 04/25/2020 9:07:53 AM 911 Outcome Documentation Hermelinda Dellen, RN, [name] Phillips Reason: Refused 04/25/2020 9:09:21 AM Call EMS 911 Now Yes Hermelinda Dellen, RN, [name] Phillips Disagree/Comply Disagree Caller Understands Yes PreDisposition Call another nurse Care Advice Given Per Guideline CALL EMS 911 NOW: * Immediate medical attention is needed. You need to hang up and call 911 (or an ambulance). Comments User: [name] Elk, RN Date/Time [name] Phillips Time): 04/25/2020 9:04:44 AM Hx: gastrointestinal issues User: [name] Elk, RN Date/Time (Eastern Time): 04/25/2020 9:09:16 AM Caller reports he works at [employer] and plans to go to the student health services PRIOR to calling EMS. Reports if they encourage EMS, or his symptoms worsen he will call EMS. Referrals GO TO FACILITY REFUSED REFERRED TO PCP OFFICE

## 2020-04-25 NOTE — Telephone Encounter (Signed)
Pt reports yesterday he had chest and abdominal discomfort due to gas and GERD. He has hx of GERD and gas. He is no longer taking omeprazole but he did take gas X and that did help. Pt reports he stopped taking omeprazole because he did not want to take a medication everyday.  Pt denies any chest pain today and only reports a "mild sensation below my stomach, like gas. I feel a lot better today." Pt did go to university this morning at 0923 and BP was 111/78 and HR was 76. Pt denies SOB, radiating arm pain, any chest pain, or any other pain. Advised pt he should have f/u apt with PCP to address concern of GERD and gas. Pt agreed and asked for apt today or tomorrow. Scheduled pt for tomorrow. Advised pt if any symptoms changed to contact this office. Gave pt ER precautions. Pt verbalized understanding.

## 2020-04-26 ENCOUNTER — Ambulatory Visit: Payer: BC Managed Care – PPO | Admitting: Primary Care

## 2020-04-26 ENCOUNTER — Encounter: Payer: Self-pay | Admitting: Primary Care

## 2020-04-26 ENCOUNTER — Other Ambulatory Visit: Payer: Self-pay

## 2020-04-26 VITALS — BP 116/62 | HR 82 | Temp 98.8°F | Ht 72.75 in | Wt 237.0 lb

## 2020-04-26 DIAGNOSIS — Z23 Encounter for immunization: Secondary | ICD-10-CM | POA: Diagnosis not present

## 2020-04-26 DIAGNOSIS — K219 Gastro-esophageal reflux disease without esophagitis: Secondary | ICD-10-CM

## 2020-04-26 DIAGNOSIS — R1010 Upper abdominal pain, unspecified: Secondary | ICD-10-CM

## 2020-04-26 MED ORDER — OMEPRAZOLE 20 MG PO CPDR: 20.0000 mg | DELAYED_RELEASE_CAPSULE | Freq: Every day | ORAL | 0 refills | Status: DC

## 2020-04-26 NOTE — Progress Notes (Signed)
Subjective:    Patient ID: [name] Phillips, male    DOB: [birth year], 29 y.o.   MRN: [id number]  HPI  This visit occurred during the SARS-CoV-2 public health emergency.  Safety protocols were in place, including screening questions prior to the visit, additional usage of staff PPE, and extensive cleaning of exam room while observing appropriate contact time as indicated for disinfecting solutions.   [name] Phillips is a 29 year old male with a history of GERD, HSV 2 who presents today with a chief complaint of abdominal pain and shoulder pain.  Symptoms include right anterior shoulder pain with radiation down his right lateral trunk around to the right and mid upper abdomen. This began two days ago while at work. 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 He described his shoulder and side pain as "a gas feeling". These symptoms were short lived and have since resolved.   He does continue to experience intermittent symptoms esophageal fullness, chest pressure, the need to belch. This has been chronic for several years and historically improved/resolved with PPI use. Last year he was treated with a one month supply of omeprazole 40 mg which resolved symptoms.  Since his last visit he's had intermittent symptoms, will typically take an OTC Prilosec for prevention of symptoms if he will be eating a spicy/acidic meal. On average he took Prilosec 2-3 times weekly, sometimes would go weeks without. This has been effective.   Recently he's noticed more frequent symptoms, mostly worse and frequent at night.  Yesterday he experienced esophageal fullness, also with RUQ pain which radiated up to his right anterior chest. He infrequently experiences belching and esophageal burning. Over the last several days he's noticed chest pressure with the need to belch.  Known triggers for symptoms include eating spicy, acidic foods.he's learned to avoid laying flat shortly after eating as this will  cause symptoms. He denies nausea, vomiting, blood stools, constipation, diarrhea, family history of esophageal disorder.   Review of Systems  Constitutional: Negative for fever and unexpected weight change.  Gastrointestinal: Positive for abdominal pain. Negative for constipation, diarrhea, nausea and vomiting.       See HPI       Past Medical History:  Diagnosis Date  . Acute blood loss anemia 10/07/2013  . GI bleed 10/05/2013  . Herpes   . PUD (peptic ulcer disease)      Social History   Socioeconomic History  . Marital status: Single    Spouse name: Not on file  . Number of children: Not on file  . Years of education: Not on file  . Highest education level: Not on file  Occupational History  . Not on file  Tobacco Use  . Smoking status: Never Smoker  . Smokeless tobacco: Never Used  Substance and Sexual Activity  . Alcohol use: Not Currently    Comment: rarely   . Drug use: No  . Sexual activity: Never  Other Topics Concern  . Not on file  Social History Narrative   Single.   No children.   Works in [place].   Enjoys reading, playing video games, exercise.   Social Determinants of Health   Financial Resource Strain:   . Difficulty of Paying Living Expenses: Not on file  Food Insecurity:   . Worried About Programme researcher, broadcasting/film/video in the Last Year: Not on file  . Ran Out of Food in the Last Year: Not on file  Transportation Needs:   .  Lack of Transportation (Medical): Not on file  . Lack of Transportation (Non-Medical): Not on file  Physical Activity:   . Days of Exercise per Week: Not on file  . Minutes of Exercise per Session: Not on file  Stress:   . Feeling of Stress : Not on file  Social Connections:   . Frequency of Communication with Friends and Family: Not on file  . Frequency of Social Gatherings with Friends and Family: Not on file  . Attends Religious Services: Not on file  . Active Member of Clubs or Organizations: Not on file  . Attends Occupational hygienist Meetings: Not on file  . Marital Status: Not on file  Intimate Partner Violence:   . Fear of Current or Ex-Partner: Not on file  . Emotionally Abused: Not on file  . Physically Abused: Not on file  . Sexually Abused: Not on file    Past Surgical History:  Procedure Laterality Date  . ESOPHAGOGASTRODUODENOSCOPY N/A 10/05/2013   Procedure: ESOPHAGOGASTRODUODENOSCOPY (EGD);  Surgeon: Theda Belfast, MD;  Location: [facility];  Service: Endoscopy;  Laterality: N/A;  . ESOPHAGOGASTRODUODENOSCOPY ENDOSCOPY    . WISDOM TOOTH EXTRACTION      Family History  Problem Relation Age of Onset  . CAD Mother   . Diabetes Mellitus II Mother   . Stroke Mother   . Prostate cancer Father   . Hypertension Father   . Asthma Brother     No Known Allergies  Current Outpatient Medications on File Prior to Visit  Medication Sig Dispense Refill  . valACYclovir (VALTREX) 500 MG tablet Take 1 tablet by mouth twice daily for three days as needed for outbreaks. 6 tablet 1   No current facility-administered medications on file prior to visit.    BP 116/62   Pulse 82   Temp 98.8 F (37.1 C) (Temporal)   Ht 6' 0.75" (1.848 m)   Wt 237 lb (107.5 kg)   SpO2 98%   BMI 31.48 kg/m    Objective:   Physical Exam Cardiovascular:     Rate and Rhythm: Normal rate and regular rhythm.  Pulmonary:     Effort: Pulmonary effort is normal.     Breath sounds: Normal breath sounds.  Abdominal:     General: Abdomen is flat. Bowel sounds are normal.     Palpations: Abdomen is soft.     Tenderness: There is no abdominal tenderness. Negative signs include Murphy's sign.  Musculoskeletal:     Cervical back: Neck supple.  Skin:    General: Skin is warm and dry.            Assessment & Plan:

## 2020-04-26 NOTE — Assessment & Plan Note (Signed)
Likely from GERD, but will obtain abdominal ultrasound for evaluation of gall bladder and to follow up on liver foci.   Negative exam today, he is in no distress.  Trial of PPI sent to pharmacy as this has historically worked.

## 2020-04-26 NOTE — Patient Instructions (Addendum)
Start omeprazole 20 mg daily for heartburn symptoms. Take this for about 4-6 weeks daily, then reduce to every other day until complete.  Use Tums for breakthrough symptoms.   You will be contacted regarding your ultrasound.  Please let us know if you have not been contacted within one week.     Please update me as discussed.   It was a pleasure to see you today!    Food Choices for Gastroesophageal Reflux Disease, Adult When you have gastroesophageal reflux disease (GERD), the foods you eat and your eating habits are very important. Choosing the right foods can help ease the discomfort of GERD. Consider working with a diet and nutrition specialist (dietitian) to help you make healthy food choices. What general guidelines should I follow?  Eating plan  Choose healthy foods low in fat, such as fruits, vegetables, whole grains, low-fat dairy products, and lean meat, fish, and poultry.  Eat frequent, small meals instead of three large meals each day. Eat your meals slowly, in a relaxed setting. Avoid bending over or lying down until 2-3 hours after eating.  Limit high-fat foods such as fatty meats or fried foods.  Limit your intake of oils, butter, and shortening to less than 8 teaspoons each day.  Avoid the following: ? Foods that cause symptoms. These may be different for different people. Keep a food diary to keep track of foods that cause symptoms. ? Alcohol. ? Drinking large amounts of liquid with meals. ? Eating meals during the 2-3 hours before bed.  Cook foods using methods other than frying. This may include baking, grilling, or broiling. Lifestyle  Maintain a healthy weight. Ask your health care provider what weight is healthy for you. If you need to lose weight, work with your health care provider to do so safely.  Exercise for at least 30 minutes on 5 or more days each week, or as told by your health care provider.  Avoid wearing clothes that fit tightly around your  waist and chest.  Do not use any products that contain nicotine or tobacco, such as cigarettes and e-cigarettes. If you need help quitting, ask your health care provider.  Sleep with the head of your bed raised. Use a wedge under the mattress or blocks under the bed frame to raise the head of the bed. What foods are not recommended? The items listed may not be a complete list. Talk with your dietitian about what dietary choices are best for you. Grains Pastries or quick breads with added fat. [place] toast. Vegetables Deep fried vegetables. [place] fries. Any vegetables prepared with added fat. Any vegetables that cause symptoms. For some people this may include tomatoes and tomato products, chili peppers, onions and garlic, and horseradish. Fruits Any fruits prepared with added fat. Any fruits that cause symptoms. For some people this may include citrus fruits, such as oranges, grapefruit, pineapple, and lemons. Meats and other protein foods High-fat meats, such as fatty beef or pork, hot dogs, ribs, ham, sausage, salami and bacon. Fried meat or protein, including fried fish and fried chicken. Nuts and nut butters. Dairy Whole milk and chocolate milk. Sour cream. Cream. Ice cream. Cream cheese. Milk shakes. Beverages Coffee and tea, with or without caffeine. Carbonated beverages. Sodas. Energy drinks. Fruit juice made with acidic fruits (such as orange or grapefruit). Tomato juice. Alcoholic drinks. Fats and oils Butter. Margarine. Shortening. Ghee. Sweets and desserts Chocolate and cocoa. Donuts. Seasoning and other foods Pepper. Peppermint and spearmint. Any condiments, herbs, or  seasonings that cause symptoms. For some people, this may include curry, hot sauce, or vinegar-based salad dressings. Summary  When you have gastroesophageal reflux disease (GERD), food and lifestyle choices are very important to help ease the discomfort of GERD.  Eat frequent, small meals instead of three  large meals each day. Eat your meals slowly, in a relaxed setting. Avoid bending over or lying down until 2-3 hours after eating.  Limit high-fat foods such as fatty meat or fried foods. This information is not intended to replace advice given to you by your health care provider. Make sure you discuss any questions you have with your health care provider. Document Revised: 11/19/2018 Document Reviewed: 07/30/2016 Elsevier Patient Education  2020 Elsevier Inc.   Influenza (Flu) Vaccine (Inactivated or Recombinant): What You Need to Know 1. Why get vaccinated? Influenza vaccine can prevent influenza (flu). Flu is a contagious disease that spreads around the [place] every year, usually between October and May. Anyone can get the flu, but it is more dangerous for some people. Infants and young children, people 80 years of age and older, pregnant women, and people with certain health conditions or a weakened immune system are at greatest risk of flu complications. Pneumonia, bronchitis, sinus infections and ear infections are examples of flu-related complications. If you have a medical condition, such as heart disease, cancer or diabetes, flu can make it worse. Flu can cause fever and chills, sore throat, muscle aches, fatigue, cough, headache, and runny or stuffy nose. Some people may have vomiting and diarrhea, though this is more common in children than adults. Each year thousands of people in the [place] die from flu, and many more are hospitalized. Flu vaccine prevents millions of illnesses and flu-related visits to the doctor each year. 2. Influenza vaccine CDC recommends everyone 72 months of age and older get vaccinated every flu season. Children 6 months through 49 years of age may need 2 doses during a single flu season. Everyone else needs only 1 dose each flu season. It takes about 2 weeks for protection to develop after vaccination. There are many flu viruses, and they are always  changing. Each year a new flu vaccine is made to protect against three or four viruses that are likely to cause disease in the upcoming flu season. Even when the vaccine doesn't exactly match these viruses, it may still provide some protection. Influenza vaccine does not cause flu. Influenza vaccine may be given at the same time as other vaccines. 3. Talk with your health care provider Tell your vaccine provider if the person getting the vaccine:  Has had an allergic reaction after a previous dose of influenza vaccine, or has any severe, life-threatening allergies.  Has ever had Guillain-Barr Syndrome (also called GBS). In some cases, your health care provider may decide to postpone influenza vaccination to a future visit. People with minor illnesses, such as a cold, may be vaccinated. People who are moderately or severely ill should usually wait until they recover before getting influenza vaccine. Your health care provider can give you more information. 4. Risks of a vaccine reaction  Soreness, redness, and swelling where shot is given, fever, muscle aches, and headache can happen after influenza vaccine.  There may be a very small increased risk of Guillain-Barr Syndrome (GBS) after inactivated influenza vaccine (the flu shot). Young children who get the flu shot along with pneumococcal vaccine (PCV13), and/or DTaP vaccine at the same time might be slightly more likely to have a  seizure caused by fever. Tell your health care provider if a child who is getting flu vaccine has ever had a seizure. People sometimes faint after medical procedures, including vaccination. Tell your provider if you feel dizzy or have vision changes or ringing in the ears. As with any medicine, there is a very remote chance of a vaccine causing a severe allergic reaction, other serious injury, or death. 5. What if there is a serious problem? An allergic reaction could occur after the vaccinated person leaves the  clinic. If you see signs of a severe allergic reaction (hives, swelling of the face and throat, difficulty breathing, a fast heartbeat, dizziness, or weakness), call 9-1-1 and get the person to the nearest hospital. For other signs that concern you, call your health care provider. Adverse reactions should be reported to the Vaccine Adverse Event Reporting System (VAERS). Your health care provider will usually file this report, or you can do it yourself. Visit the VAERS website at www.vaers.LAgents.no or call (515)690-3445.VAERS is only for reporting reactions, and VAERS staff do not give medical advice. 6. The National Vaccine Injury Compensation Program The [employer] Vaccine Injury Compensation Program (VICP) is a federal program that was created to compensate people who may have been injured by certain vaccines. Visit the VICP website at SpiritualWord.at or call 708-491-0095 to learn about the program and about filing a claim. There is a time limit to file a claim for compensation. 7. How can I learn more?  Ask your healthcare provider.  Call your local or state health department.  Contact the Centers for Disease Control and Prevention (CDC): ? Call 909-030-2397 (1-800-CDC-INFO) or ? Visit CDC's BiotechRoom.com.cy Vaccine Information Statement (Interim) Inactivated Influenza Vaccine (03/26/2018) This information is not intended to replace advice given to you by your health care provider. Make sure you discuss any questions you have with your health care provider. Document Revised: 11/17/2018 Document Reviewed: 03/30/2018 Elsevier Patient Education  2020 [employer].

## 2020-04-26 NOTE — Assessment & Plan Note (Signed)
Symptoms seem to align with GERD diagnosis mostly. He could have aggravated his shoulder at work given his position.   Given increased frequency of symptoms we will resume daily PPI use with omeprazole 20 mg for 4-6 weeks duration. We also need to repeat his RUQ abdominal ultrasound given the foci noted to the liver.  Rx for omeprazole 20 mg sent to pharmacy. He will update. Use Tums in between if needed.  Repeat ultrasound ordered and pending.

## 2020-04-28 ENCOUNTER — Ambulatory Visit
Admission: RE | Admit: 2020-04-28 | Discharge: 2020-04-28 | Disposition: A | Discharge status: Home / Self Care | Payer: BC Managed Care – PPO | Source: Ambulatory Visit | Attending: Primary Care | Admitting: Primary Care

## 2020-04-28 DIAGNOSIS — R1010 Upper abdominal pain, unspecified: Secondary | ICD-10-CM

## 2020-04-28 DIAGNOSIS — K219 Gastro-esophageal reflux disease without esophagitis: Secondary | ICD-10-CM

## 2020-06-17 ENCOUNTER — Other Ambulatory Visit: Payer: Self-pay | Admitting: Primary Care

## 2020-06-17 DIAGNOSIS — K219 Gastro-esophageal reflux disease without esophagitis: Secondary | ICD-10-CM

## 2020-08-01 IMAGING — US ULTRASOUND ABDOMEN COMPLETE
1 series · 13 of 25 positions shown · non-contrast
Comparison: None.

CLINICAL DATA: Upper abdominal pain

EXAM:
ABDOMEN ULTRASOUND COMPLETE

[Series 1: ultrasound abdomen complete · 0.22mm/px · 13 of 92 slices shown]
[im 1/92]
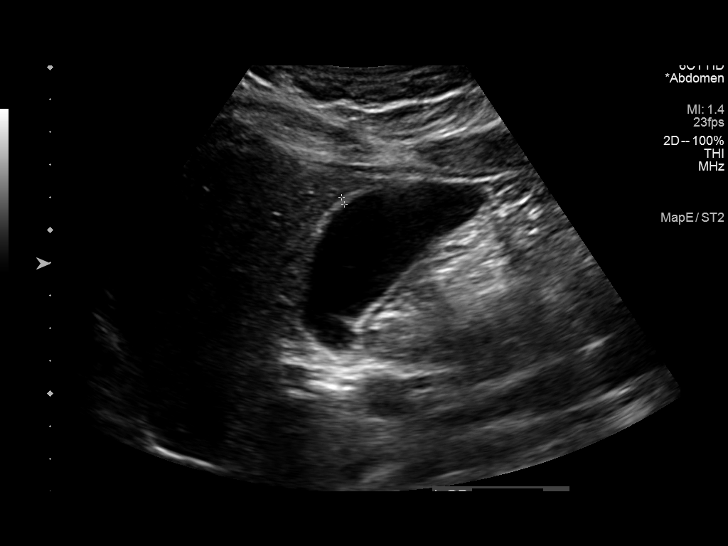
[im 8/92]
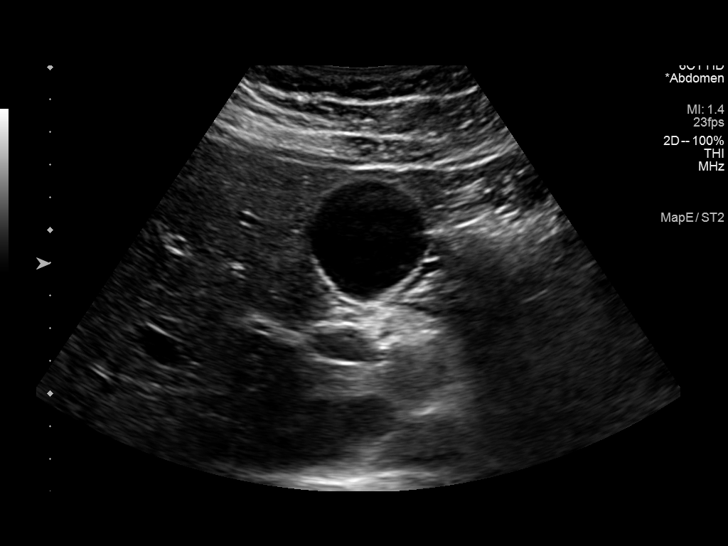
[im 16/92]
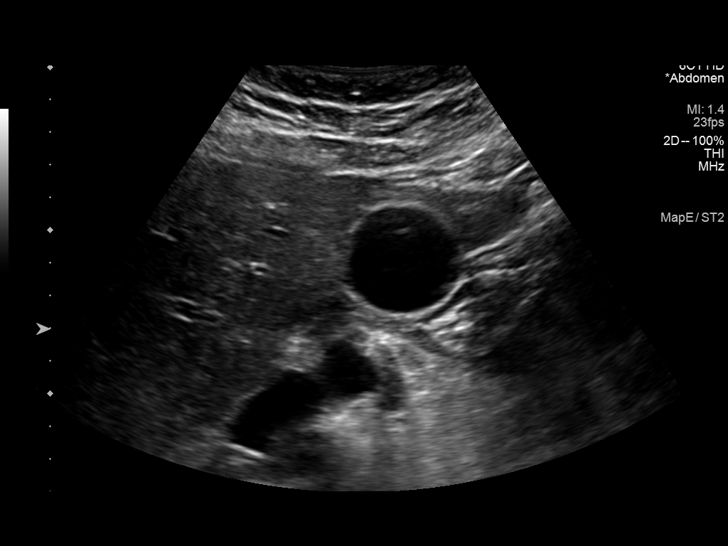
[im 23/92]
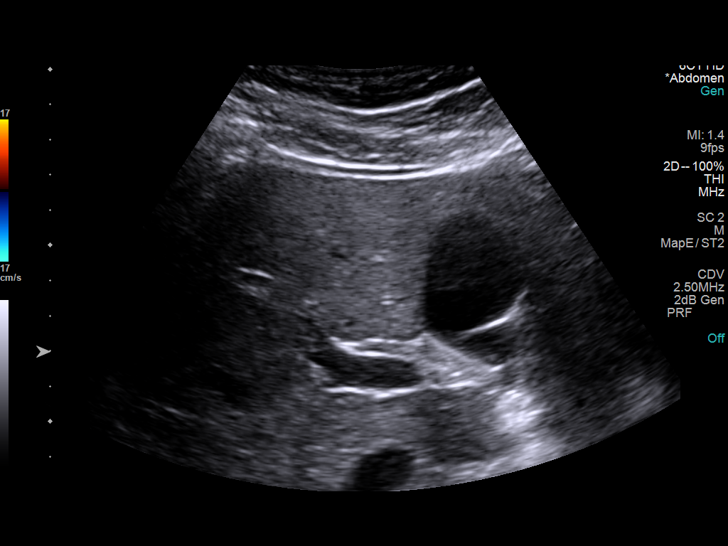
[im 31/92]
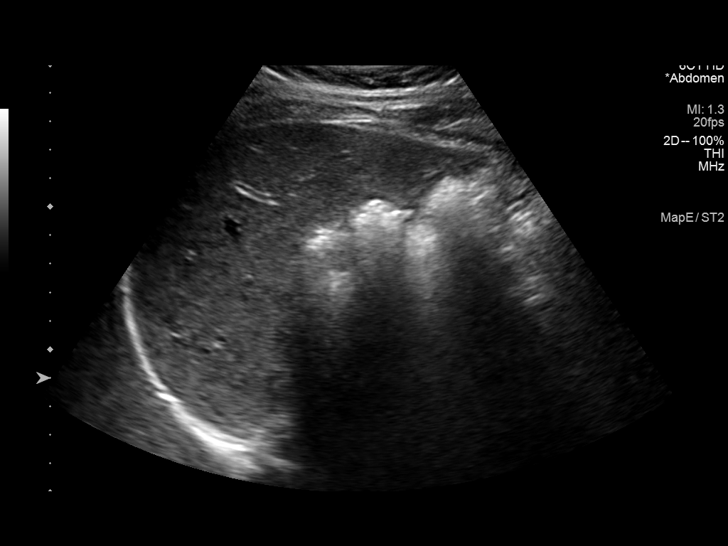
[im 38/92]
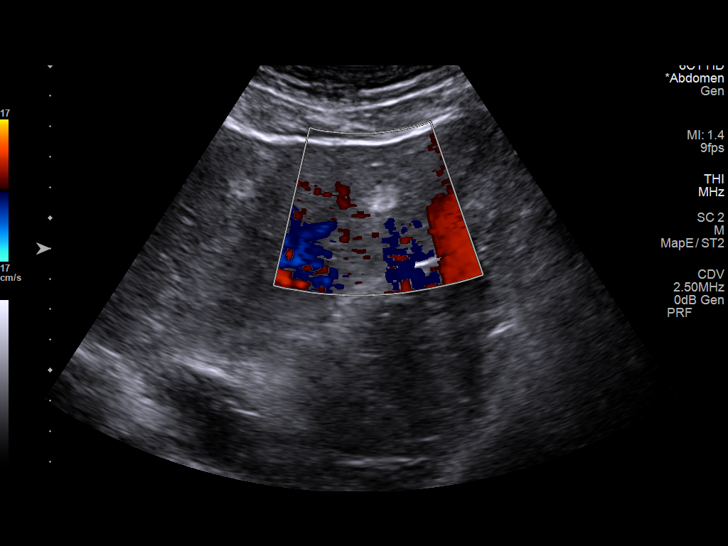
[im 46/92]
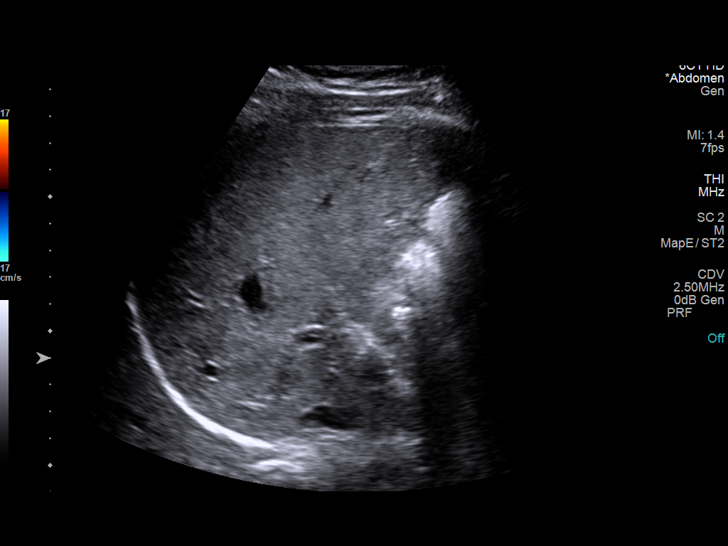
[im 54/92]
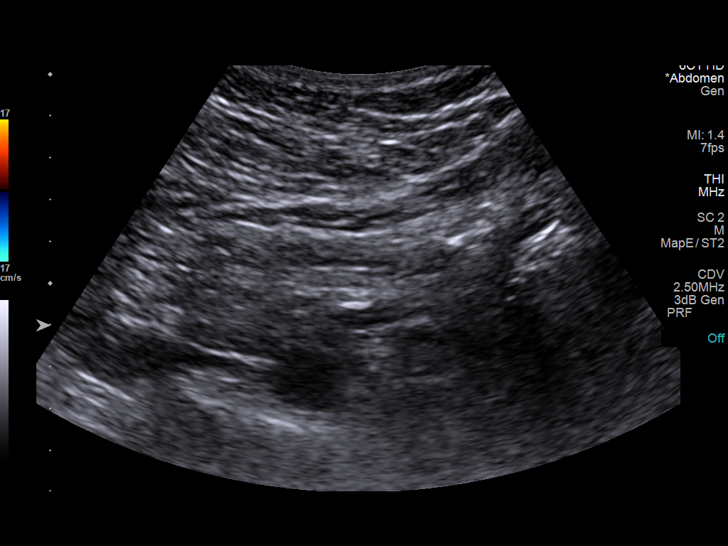
[im 61/92]
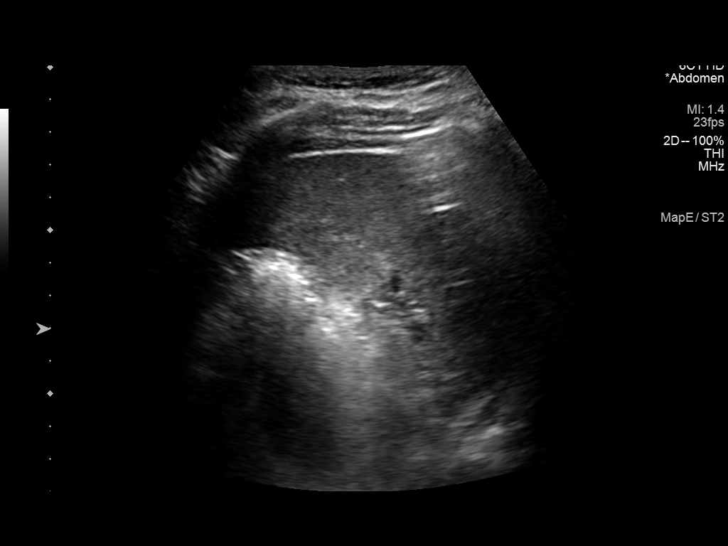
[im 69/92]
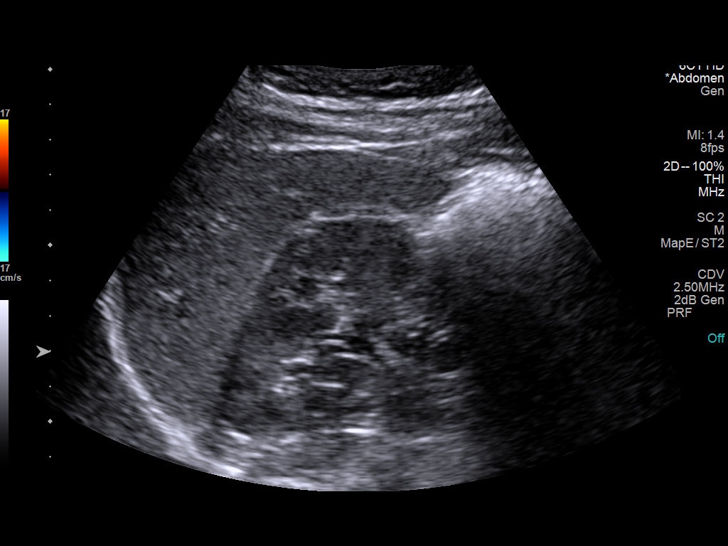
[im 76/92]
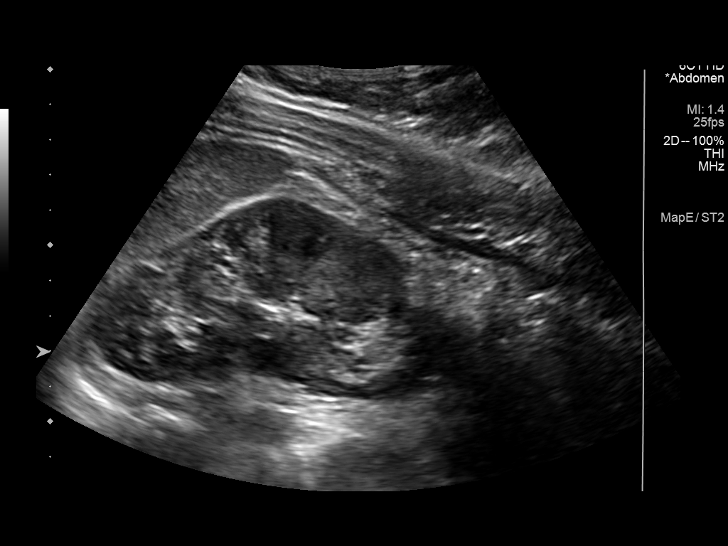
[im 84/92]
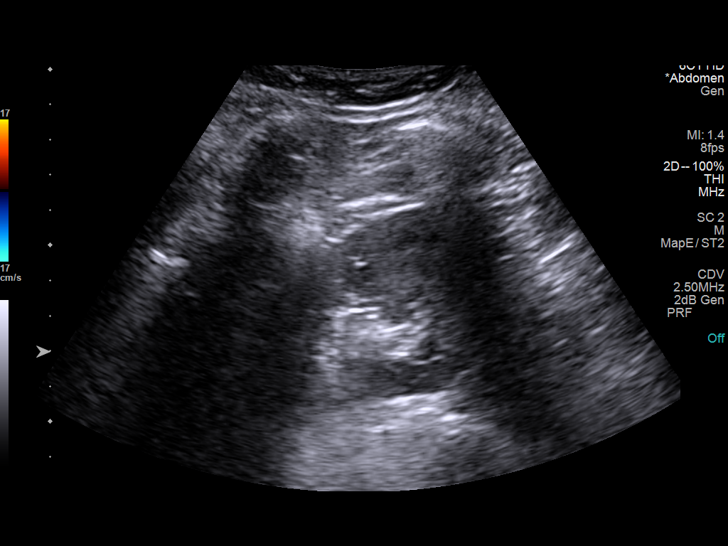
[im 92/92]
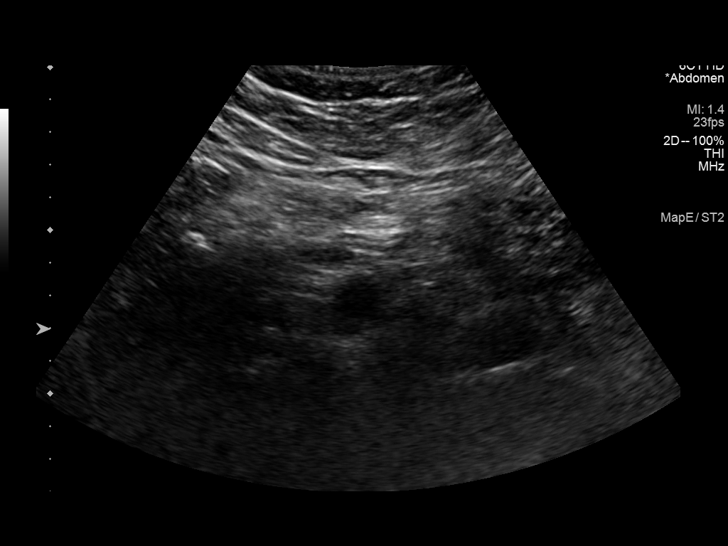

[13 of 25 positions shown; findings below may reference images not displayed]

FINDINGS: Gallbladder: No gallstones or wall thickening visualized. There is
no pericholecystic fluid. No sonographic Murphy sign noted by
sonographer.

Common bile duct: Diameter: 3 mm. No intrahepatic, common hepatic,
or common bile duct dilatation.

Liver: There is a uniformly echogenic focus in the left lobe of the
liver measuring 1.0 x 1.0 x 1.1 cm. A uniformly hyperechoic focus is
also noted in the right lobe of the liver somewhat anterior in
location measuring 1.0 x 1.0 x 0.9 cm. Within normal limits in
parenchymal echogenicity. Portal vein is patent on color Doppler
imaging with normal direction of blood flow towards the liver.

IVC: No abnormality visualized.

Pancreas: Visualized portions of pancreas appear normal. Portions of
pancreas obscured by gas.

Spleen: Size and appearance within normal limits.

Right Kidney: Length: 10.3 cm. Echogenicity within normal limits. No
mass or hydronephrosis visualized.

Left Kidney: Length: 10.4 cm. Echogenicity within normal limits. No
mass or hydronephrosis visualized.

Abdominal aorta: No aneurysm visualized.

Other findings: No demonstrable ascites.
IMPRESSION: 1. Small echogenic foci in the liver, each measuring approximately 1
cm in size, likely hemangiomas. As there are no prior studies to
compare for stability, a follow-up ultrasound of the liver in
approximately 1 year is advised for further assessment. Liver
otherwise appears unremarkable.

2. Portions of pancreas obscured by gas. Visualized portions of
pancreas appear normal.

3.  Study otherwise unremarkable.

## 2020-11-29 ENCOUNTER — Other Ambulatory Visit: Payer: Self-pay

## 2020-11-29 ENCOUNTER — Encounter: Payer: Self-pay | Admitting: Primary Care

## 2020-11-29 ENCOUNTER — Ambulatory Visit (INDEPENDENT_AMBULATORY_CARE_PROVIDER_SITE_OTHER): Payer: BC Managed Care – PPO | Admitting: Primary Care

## 2020-11-29 VITALS — BP 110/62 | HR 83 | Temp 98.0°F | Ht 72.75 in | Wt 252.0 lb

## 2020-11-29 DIAGNOSIS — L299 Pruritus, unspecified: Secondary | ICD-10-CM

## 2020-11-29 MED ORDER — CLOBETASOL PROPIONATE 0.05 % EX SOLN: 1.0000 "application " | Freq: Two times a day (BID) | CUTANEOUS | 0 refills | Status: DC

## 2020-11-29 NOTE — Assessment & Plan Note (Signed)
Chronic, worse over last several months. Appears to represent scalp eczema or psoriasis.   Trial of clobetasol solution 0.5% sent to pharmacy to try. He will update.

## 2020-11-29 NOTE — Progress Notes (Signed)
Subjective:    Patient ID: [name] Phillips, male    DOB: [birth year], 30 y.o.   MRN: [id number]  HPI  [name] Phillips is a very pleasant 30 y.o. male who presents today to discuss scalp irritation.   Chronic and intermittent dryness, itchy, flaky, "patchy" spots to his scalp for years, more bothersome over the last several months.  History of childhood eczema, isn't sure of the location.   He's been using tea tree oil recently with temporary improvement. Over the years he's tried natural soaps OTC, hair/skin/nail vitamins without improvement.    Review of Systems  Constitutional: Negative for fever.  Skin:       Dry, flaky, patchy, itchy scalp         Past Medical History:  Diagnosis Date  . Acute blood loss anemia 10/07/2013  . GI bleed 10/05/2013  . Herpes   . PUD (peptic ulcer disease)     Social History   Socioeconomic History  . Marital status: Single    Spouse name: Not on file  . Number of children: Not on file  . Years of education: Not on file  . Highest education level: Not on file  Occupational History  . Not on file  Tobacco Use  . Smoking status: Never Smoker  . Smokeless tobacco: Never Used  Substance and Sexual Activity  . Alcohol use: Not Currently    Comment: rarely   . Drug use: No  . Sexual activity: Never  Other Topics Concern  . Not on file  Social History Narrative   Single.   No children.   Works in [place].   Enjoys reading, playing video games, exercise.   Social Determinants of Health   Financial Resource Strain: Not on file  Food Insecurity: Not on file  Transportation Needs: Not on file  Physical Activity: Not on file  Stress: Not on file  Social Connections: Not on file  Intimate Partner Violence: Not on file    Past Surgical History:  Procedure Laterality Date  . ESOPHAGOGASTRODUODENOSCOPY N/A 10/05/2013   Procedure: ESOPHAGOGASTRODUODENOSCOPY (EGD);  Surgeon: Theda Belfast, MD;  Location: [facility];  Service:  Endoscopy;  Laterality: N/A;  . ESOPHAGOGASTRODUODENOSCOPY ENDOSCOPY    . WISDOM TOOTH EXTRACTION      Family History  Problem Relation Age of Onset  . CAD Mother   . Diabetes Mellitus II Mother   . Stroke Mother   . Prostate cancer Father   . Hypertension Father   . Asthma Brother     No Known Allergies  Current Outpatient Medications on File Prior to Visit  Medication Sig Dispense Refill  . omeprazole (PRILOSEC) 20 MG capsule TAKE 1 CAPSULE (20 MG TOTAL) BY MOUTH DAILY. FOR HEARTBURN. 90 capsule 3  . valACYclovir (VALTREX) 500 MG tablet Take 1 tablet by mouth twice daily for three days as needed for outbreaks. 6 tablet 1   No current facility-administered medications on file prior to visit.    BP 110/62   Pulse 83   Temp 98 F (36.7 C) (Temporal)   Ht 6' 0.75" (1.848 m)   Wt 252 lb (114.3 kg)   SpO2 96%   BMI 33.48 kg/m  Objective:   Physical Exam Skin:    General: Skin is warm and dry.     Findings: No erythema.     Comments: Dry, slightly hyperpigmented, rough skin to frontal scalp with hair thinning.   Neurological:     Mental Status: He is alert.  Assessment & Plan:      This visit occurred during the SARS-CoV-2 public health emergency.  Safety protocols were in place, including screening questions prior to the visit, additional usage of staff PPE, and extensive cleaning of exam room while observing appropriate contact time as indicated for disinfecting solutions.

## 2020-11-29 NOTE — Patient Instructions (Signed)
Try the clobetasol 0.5% scalp solution for itching, thinning, and dryness. This is applied twice daily.  Please update me in a few weeks.   You can apply plain hydrocortisone cream (OTC) to your face twice daily for about one week at a time.   It was a pleasure to see you today!

## 2021-04-30 ENCOUNTER — Other Ambulatory Visit: Payer: Self-pay

## 2021-04-30 DIAGNOSIS — L299 Pruritus, unspecified: Secondary | ICD-10-CM

## 2021-05-02 MED ORDER — CLOBETASOL PROPIONATE 0.05 % EX SOLN: 1.0000 "application " | Freq: Two times a day (BID) | CUTANEOUS | 0 refills | Status: DC

## 2021-08-22 IMAGING — US US ABDOMEN LIMITED
1 series · 14 of 25 positions shown · non-contrast
Comparison: Ultrasound abdomen 04/08/2019

CLINICAL DATA: Previously identified foci within the liver likely
representing hemangiomas but warranting follow-up one year later as
these were new. History of GI bleed.

EXAM:
ULTRASOUND ABDOMEN LIMITED RIGHT UPPER QUADRANT

[Series 1: us abdomen limited · 0.22mm/px · 14 of 51 slices shown]
[im 1/51]
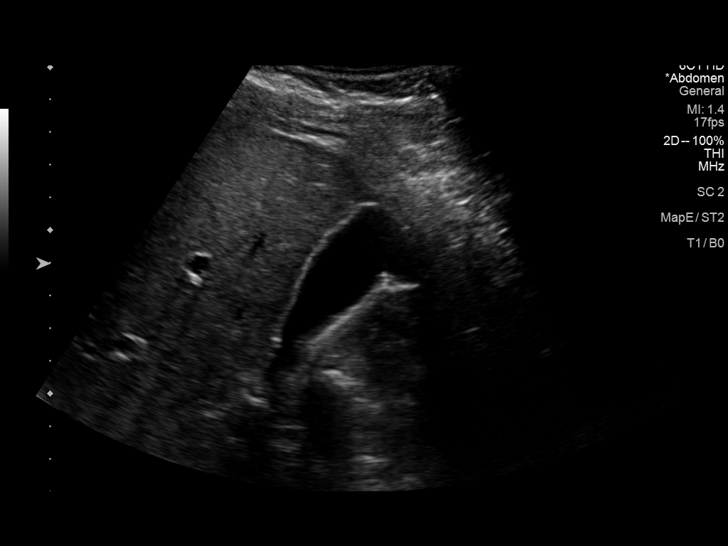
[im 5/51]
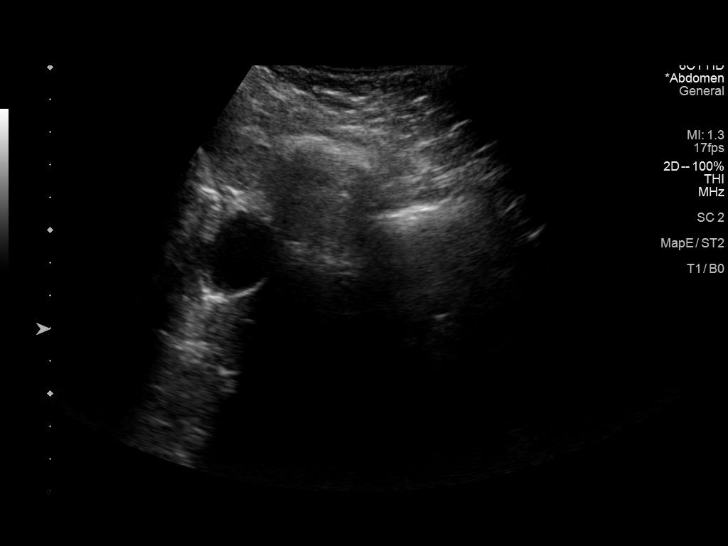
[im 9/51]
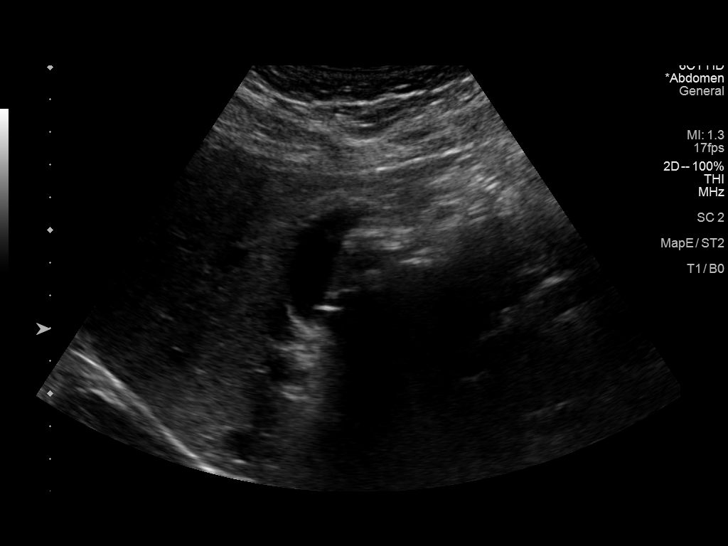
[im 13/51]
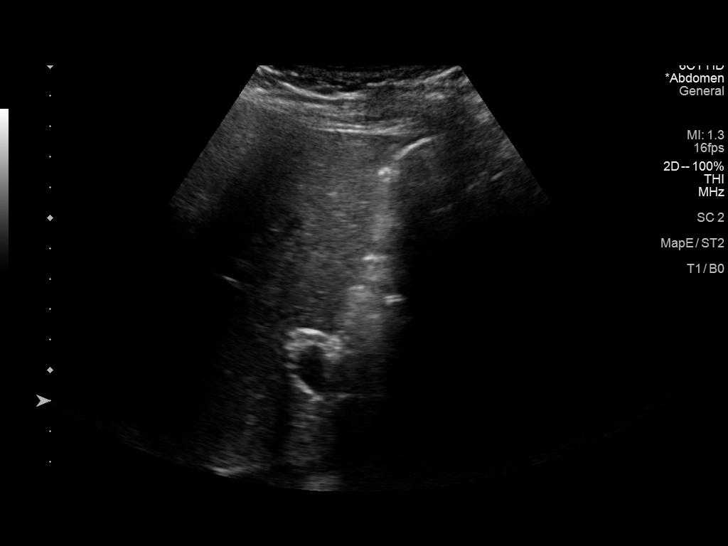
[im 17/51]
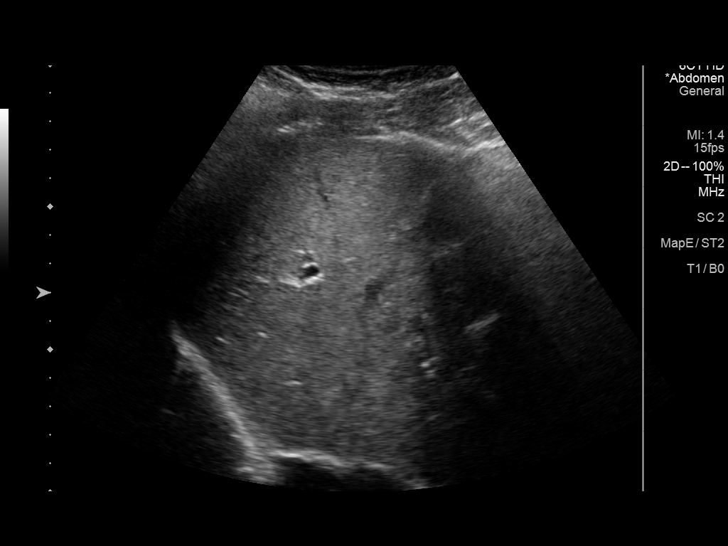
[im 19/51]
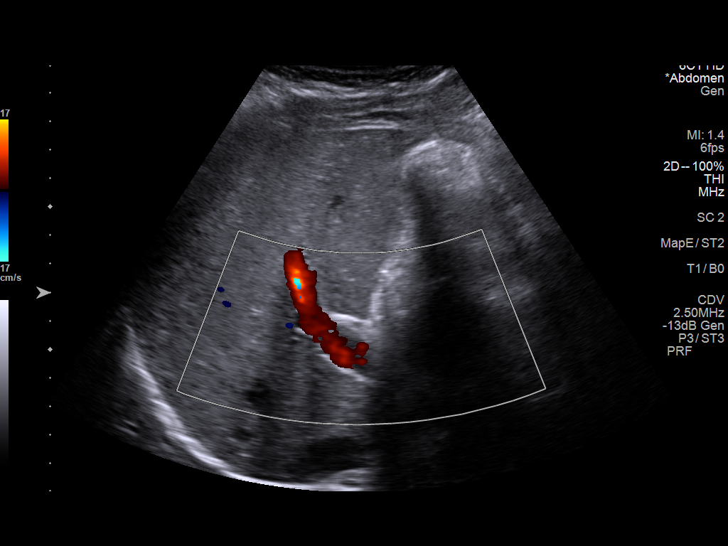
[im 23/51]
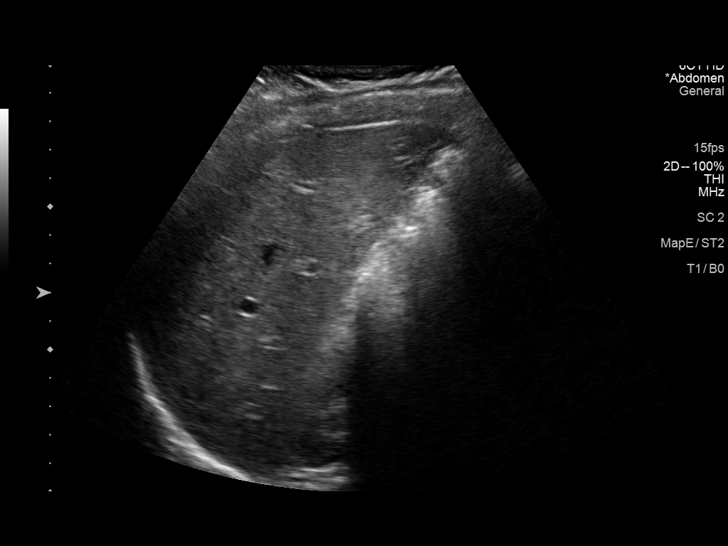
[im 28/51]
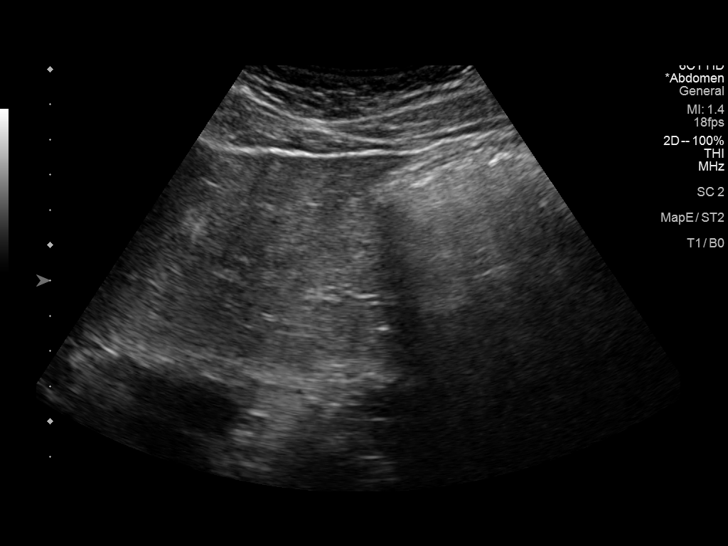
[im 32/51]
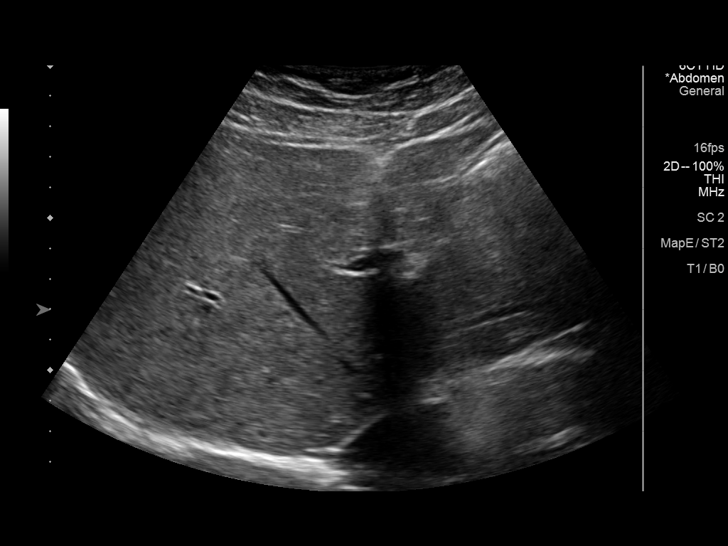
[im 34/51]
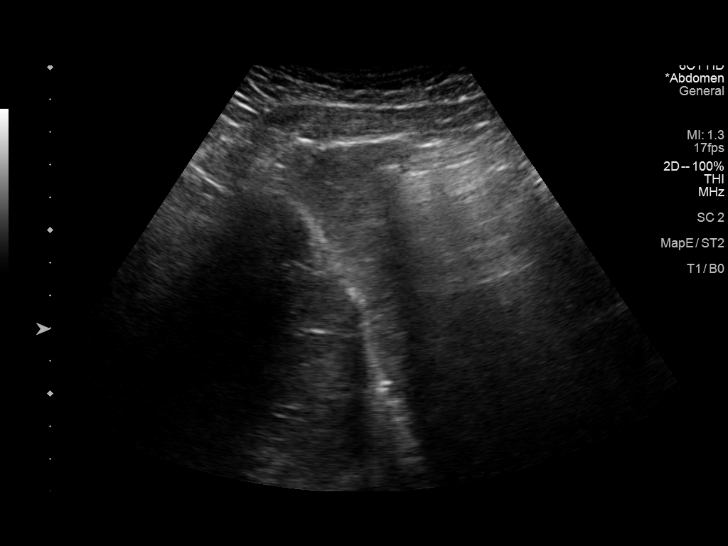
[im 38/51]
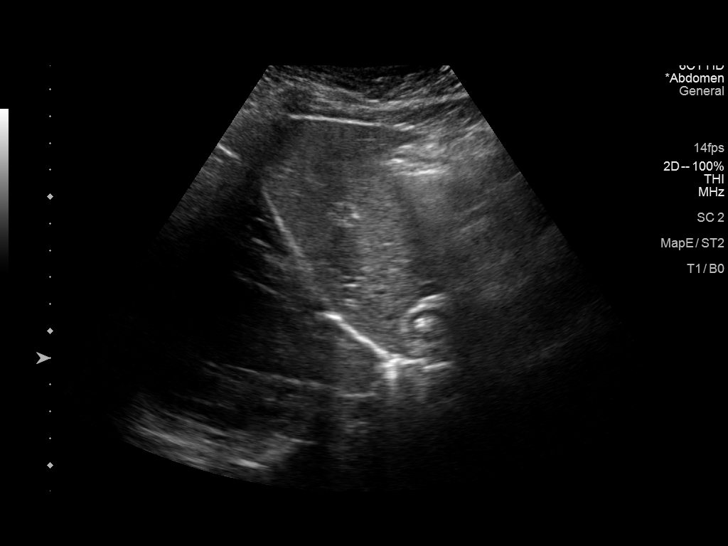
[im 42/51]
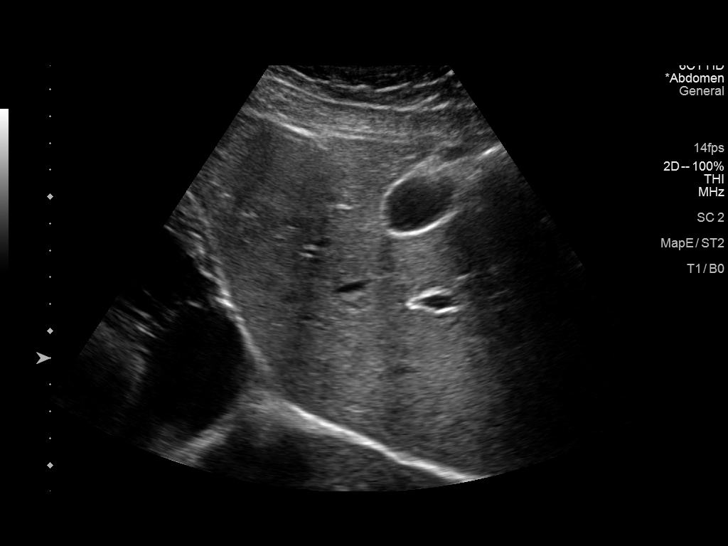
[im 46/51]
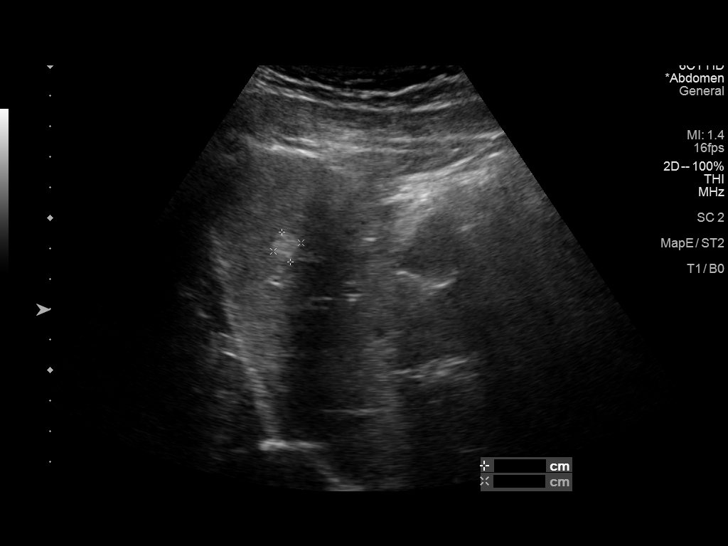
[im 51/51]
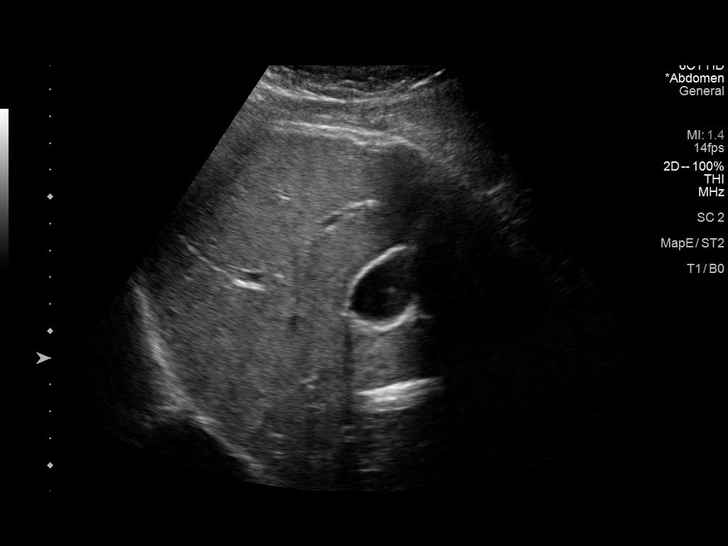

[14 of 25 positions shown; findings below may reference images not displayed]

FINDINGS: Gallbladder:

No gallstones or wall thickening visualized. No sonographic Murphy
sign noted by sonographer.

Common bile duct:

Diameter: 3 mm

Liver:

Similar-appearing left hepatic lobe echogenic foci measuring 1.1 x
0.9 x 1.2 cm. Similar-appearing right hepatic lobe echogenic foci
measuring 1 x 1 x 1 cm. Within normal limits in parenchymal
echogenicity. Portal vein is patent on color Doppler imaging with
normal direction of blood flow towards the liver.

Other: None.
IMPRESSION: Unremarkable right upper quadrant ultrasound with stable
pericentimeter hepatic hemangiomas. Considering stability in size,
no follow-up indicated.

## 2021-11-26 ENCOUNTER — Other Ambulatory Visit: Payer: Self-pay | Admitting: Primary Care

## 2021-11-26 DIAGNOSIS — L299 Pruritus, unspecified: Secondary | ICD-10-CM

## 2021-12-18 ENCOUNTER — Ambulatory Visit: Payer: BC Managed Care – PPO | Admitting: Family Medicine

## 2021-12-18 VITALS — BP 120/70 | HR 87 | Temp 98.1°F | Ht 72.75 in | Wt 238.5 lb

## 2021-12-18 DIAGNOSIS — L299 Pruritus, unspecified: Secondary | ICD-10-CM

## 2021-12-18 DIAGNOSIS — M545 Low back pain, unspecified: Secondary | ICD-10-CM | POA: Insufficient documentation

## 2021-12-18 DIAGNOSIS — R1013 Epigastric pain: Secondary | ICD-10-CM | POA: Diagnosis not present

## 2021-12-18 DIAGNOSIS — R109 Unspecified abdominal pain: Secondary | ICD-10-CM | POA: Diagnosis not present

## 2021-12-18 DIAGNOSIS — Z8719 Personal history of other diseases of the digestive system: Secondary | ICD-10-CM | POA: Diagnosis not present

## 2021-12-18 DIAGNOSIS — K219 Gastro-esophageal reflux disease without esophagitis: Secondary | ICD-10-CM

## 2021-12-18 LAB — COMPREHENSIVE METABOLIC PANEL
ALT: 24 U/L (ref 0–53)
AST: 30 U/L (ref 0–37)
Albumin: 4.4 g/dL (ref 3.5–5.2)
Alkaline Phosphatase: 71 U/L (ref 39–117)
BUN: 11 mg/dL (ref 6–23)
CO2: 27 mEq/L (ref 19–32)
Calcium: 9.6 mg/dL (ref 8.4–10.5)
Chloride: 102 mEq/L (ref 96–112)
Creatinine, Ser: 1.21 mg/dL (ref 0.40–1.50)
GFR: 79.02 mL/min (ref >60.00)
Glucose, Bld: 90 mg/dL (ref 70–99)
Potassium: 3.7 mEq/L (ref 3.5–5.1)
Sodium: 137 mEq/L (ref 135–145)
Total Bilirubin: 0.5 mg/dL (ref 0.2–1.2)
Total Protein: 8.1 g/dL (ref 6.0–8.3)

## 2021-12-18 LAB — CBC WITH DIFFERENTIAL/PLATELET
Basophils Absolute: 0.1 10*3/uL (ref 0.0–0.1)
Basophils Relative: 0.9 % (ref 0.0–3.0)
Eosinophils Absolute: 0.4 10*3/uL (ref 0.0–0.7)
Eosinophils Relative: 4 % (ref 0.0–5.0)
HCT: 36.8 % — ABNORMAL LOW (ref 39.0–52.0)
Hemoglobin: 11.4 g/dL — ABNORMAL LOW (ref 13.0–17.0)
Lymphocytes Relative: 20.2 % (ref 12.0–46.0)
Lymphs Abs: 1.9 10*3/uL (ref 0.7–4.0)
MCHC: 30.8 g/dL (ref 30.0–36.0)
MCV: 65.8 fl — ABNORMAL LOW (ref 78.0–100.0)
Monocytes Absolute: 0.7 10*3/uL (ref 0.1–1.0)
Monocytes Relative: 8 % (ref 3.0–12.0)
Neutro Abs: 6.2 10*3/uL (ref 1.4–7.7)
Neutrophils Relative %: 66.9 % (ref 43.0–77.0)
Platelets: 312 10*3/uL (ref 150.0–400.0)
RBC: 5.6 Mil/uL (ref 4.22–5.81)
RDW: 21.8 % — ABNORMAL HIGH (ref 11.5–15.5)
WBC: 9.2 10*3/uL (ref 4.0–10.5)

## 2021-12-18 LAB — POC URINALSYSI DIPSTICK (AUTOMATED)
Bilirubin, UA: NEGATIVE
Blood, UA: NEGATIVE
Glucose, UA: NEGATIVE
Ketones, UA: NEGATIVE
Leukocytes, UA: NEGATIVE
Nitrite, UA: NEGATIVE
Protein, UA: POSITIVE — AB
Spec Grav, UA: >=1.03 — AB (ref 1.010–1.025)
Urobilinogen, UA: 0.2 E.U./dL
pH, UA: 5.5 (ref 5.0–8.0)

## 2021-12-18 MED ORDER — OMEPRAZOLE 40 MG PO CPDR: 40.0000 mg | DELAYED_RELEASE_CAPSULE | Freq: Every day | ORAL | 1 refills | Status: DC

## 2021-12-18 MED ORDER — CLOBETASOL PROPIONATE 0.05 % EX SOLN: 1.0000 "application " | Freq: Two times a day (BID) | CUTANEOUS | 0 refills | Status: AC

## 2021-12-18 NOTE — Assessment & Plan Note (Signed)
Reassuring exam suspect this is related to his work and [employer] source.  Continue to monitor but minimize NSAIDs due to history of GI issues. ?

## 2021-12-18 NOTE — Assessment & Plan Note (Signed)
Patient with upper abdominal pain/chest pain associated with after eating and history of acid reflux as well as peptic ulcer and GI bleed.  Discussed trial of high-dose omeprazole 40 mg sent to pharmacy.  Red flags and ER precautions discussed with patient.  Labs including CBC and CMP ordered to evaluate for infection and liver disease. ?

## 2021-12-18 NOTE — Assessment & Plan Note (Signed)
Stable, refill requested. ?

## 2021-12-18 NOTE — Assessment & Plan Note (Signed)
Etiology not entirely clear, no blood in the urine to suggest a kidney stone however it is colicky and resolves on its own.  Discussed if this is worsening to update and may consider CT imaging at that time.  Of note he does have a history of hemangiomas of the liver and has had 2 ultrasounds which show stability for this.  If right sided pain is persisting despite improvement of chest pain with Prilosec may consider further imaging to evaluate. ?

## 2021-12-18 NOTE — Progress Notes (Addendum)
? ?Subjective:  ? ?  ?[name] Phillips is a 31 y.o. male presenting for Back Pain (Lower R side ), Flank Pain (R upper ), and Chest Pain (In the sternum area x almost 3 weeks. Eating and drinking makes pain worse. Pepto made the pain much worse. ) ?  ? ? ?Back Pain ?Associated symptoms include chest pain. Pertinent negatives include no abdominal pain or dysuria.  ?Flank Pain ?Associated symptoms include chest pain. Pertinent negatives include no abdominal pain or dysuria.  ?Chest Pain  ?Associated symptoms include back pain. Pertinent negatives include no abdominal pain, nausea or vomiting.  ? ?#Chest pain ?- started a few weeks ago ?- eating/drinking makes this worse ?- thought it was heartburn ?- also does physical labor at work ?- getting low back pain ?- also getting right side pain - which "came out of nowhere" ?- when it gets bad will get all three at once ?- most severe episodes will get pressure in the right ear and pelvis area ?- pepto made the pain worse ?- hx of GERD ?- ran out of omeprazole last week - started taking antacid for the CP with improvement ?- tried gas-x with improvement initially but the second time this didn't help ?- endorses mild reflux ? ?Flank pain ?- comes and goes ?- severe at onset ?- feels like someone is stabbing him in the side ?- will last for a few minutes to 30 minutes ?- treats with heat with improvement ? ? ?Has taken 200 mg ibuprofen rare occasion ? ?No smoking ?Alcohol - social gatherings - a few times a month at most ? ?Hx of PUD with GI bleed in 2015 ? ?Review of Systems  ?Cardiovascular:  Positive for chest pain.  ?Gastrointestinal:  Negative for abdominal pain, blood in stool, constipation, diarrhea, nausea and vomiting.  ?Genitourinary:  Positive for flank pain. Negative for difficulty urinating, dysuria and frequency.  ?Musculoskeletal:  Positive for back pain.  ?Neurological:  Negative for light-headedness.  ? ? ?Social History  ? ?Tobacco Use  ?Smoking Status Never   ?Smokeless Tobacco Never  ? ? ? ?   ?Objective:  ?  ?BP Readings from Last 3 Encounters:  ?12/18/21 120/70  ?11/29/20 110/62  ?04/26/20 116/62  ? ?Wt Readings from Last 3 Encounters:  ?12/18/21 238 lb 8 oz (108.2 kg)  ?11/29/20 252 lb (114.3 kg)  ?04/26/20 237 lb (107.5 kg)  ? ? ?BP 120/70   Pulse 87   Temp 98.1 ?F (36.7 ?C) (Oral)   Ht 6' 0.75" (1.848 m)   Wt 238 lb 8 oz (108.2 kg)   SpO2 99%   BMI 31.68 kg/m?  ? ? ?Physical Exam ?Constitutional:   ?   Appearance: Normal appearance. He is not ill-appearing or diaphoretic.  ?HENT:  ?   Right Ear: External ear normal.  ?   Left Ear: External ear normal.  ?   Nose: Nose normal.  ?Eyes:  ?   General: No scleral icterus. ?   Extraocular Movements: Extraocular movements intact.  ?   Conjunctiva/sclera: Conjunctivae normal.  ?Cardiovascular:  ?   Rate and Rhythm: Normal rate and regular rhythm.  ?   Heart sounds: No murmur heard. ?Pulmonary:  ?   Effort: Pulmonary effort is normal. No respiratory distress.  ?   Breath sounds: Normal breath sounds. No wheezing.  ?Chest:  ?   Chest wall: No tenderness.  ?   Comments: No ttp along the right flank where he indicates pain ?Abdominal:  ?  General: Abdomen is flat. Bowel sounds are normal. There is no distension.  ?   Palpations: Abdomen is soft.  ?   Tenderness: There is no abdominal tenderness. There is no right CVA tenderness, left CVA tenderness, guarding or rebound. Negative signs include Murphy's sign.  ?Musculoskeletal:  ?   Cervical back: Neck supple.  ?   Comments: Back: ?Inspection: no step off ?Palpation: no spinous or Paraspinous ttp ?ROM: Normal w/o pain ?LE Strength: normal ?Straight leg raise: negative  ?Skin: ?   General: Skin is warm and dry.  ?Neurological:  ?   Mental Status: He is alert. Mental status is at baseline.  ?Psychiatric:     ?   Mood and Affect: Mood normal.  ? ? ? ?UA: negative blood ? ?   ?Assessment & Plan:  ? ?Problem List Items Addressed This Visit   ? ?  ? Digestive  ? GERD  (gastroesophageal reflux disease)  ? Relevant Medications  ? omeprazole (PRILOSEC) 40 MG capsule  ?  ? Musculoskeletal and Integument  ? Scalp pruritus  ?  Stable, refill requested. ? ?  ?  ? Relevant Medications  ? clobetasol (TEMOVATE) 0.05 % external solution  ?  ? Other  ? Abdominal pain - Primary  ?  Patient with upper abdominal pain/chest pain associated with after eating and history of acid reflux as well as peptic ulcer and GI bleed.  Discussed trial of high-dose omeprazole 40 mg sent to pharmacy.  Red flags and ER precautions discussed with patient.  Labs including CBC and CMP ordered to evaluate for infection and liver disease. ? ?  ?  ? Relevant Orders  ? CBC with Differential/Platelet  ? Comprehensive metabolic panel  ? History of GI bleed  ? Relevant Orders  ? CBC with Differential/Platelet  ? Flank pain  ?  Etiology not entirely clear, no blood in the urine to suggest a kidney stone however it is colicky and resolves on its own.  Discussed if this is worsening to update and may consider CT imaging at that time.  Of note he does have a history of hemangiomas of the liver and has had 2 ultrasounds which show stability for this.  If right sided pain is persisting despite improvement of chest pain with Prilosec may consider further imaging to evaluate. ? ?  ?  ? Acute low back pain  ?  Reassuring exam suspect this is related to his work and [employer] source.  Continue to monitor but minimize NSAIDs due to history of GI issues. ? ?  ?  ? Relevant Orders  ? POCT Urinalysis Dipstick (Automated) (Completed)  ? ? ? ?Return if symptoms worsen or fail to improve. ? ?Lynnda Child, MD ? ? ? ?

## 2021-12-18 NOTE — Patient Instructions (Signed)
Take omeprazole 40 mg daily ?If no improvement in 1-2 weeks call ? ? ?

## 2021-12-21 ENCOUNTER — Ambulatory Visit: Payer: BC Managed Care – PPO | Admitting: Primary Care

## 2022-01-09 ENCOUNTER — Other Ambulatory Visit: Payer: Self-pay | Admitting: Family Medicine

## 2022-01-09 DIAGNOSIS — K219 Gastro-esophageal reflux disease without esophagitis: Secondary | ICD-10-CM

## 2022-02-06 ENCOUNTER — Ambulatory Visit: Payer: BC Managed Care – PPO | Admitting: Primary Care

## 2022-02-12 ENCOUNTER — Other Ambulatory Visit: Payer: Self-pay | Admitting: Family Medicine

## 2022-02-12 DIAGNOSIS — K219 Gastro-esophageal reflux disease without esophagitis: Secondary | ICD-10-CM

## 2022-02-27 ENCOUNTER — Other Ambulatory Visit: Payer: Self-pay | Admitting: Primary Care

## 2022-02-27 ENCOUNTER — Ambulatory Visit: Payer: BC Managed Care – PPO | Admitting: Primary Care

## 2022-02-27 DIAGNOSIS — K219 Gastro-esophageal reflux disease without esophagitis: Secondary | ICD-10-CM

## 2022-03-13 ENCOUNTER — Ambulatory Visit: Payer: BC Managed Care – PPO | Admitting: Primary Care

## 2022-03-13 ENCOUNTER — Other Ambulatory Visit: Payer: Self-pay | Admitting: Primary Care

## 2022-03-13 ENCOUNTER — Telehealth: Payer: Self-pay | Admitting: Radiology

## 2022-03-13 ENCOUNTER — Encounter: Payer: Self-pay | Admitting: Primary Care

## 2022-03-13 VITALS — BP 130/68 | HR 88 | Temp 98.6°F | Ht 73.0 in | Wt 251.0 lb

## 2022-03-13 DIAGNOSIS — Z114 Encounter for screening for human immunodeficiency virus [HIV]: Secondary | ICD-10-CM

## 2022-03-13 DIAGNOSIS — K219 Gastro-esophageal reflux disease without esophagitis: Secondary | ICD-10-CM | POA: Diagnosis not present

## 2022-03-13 DIAGNOSIS — Z1159 Encounter for screening for other viral diseases: Secondary | ICD-10-CM

## 2022-03-13 DIAGNOSIS — A6000 Herpesviral infection of urogenital system, unspecified: Secondary | ICD-10-CM

## 2022-03-13 DIAGNOSIS — Z0000 Encounter for general adult medical examination without abnormal findings: Secondary | ICD-10-CM | POA: Diagnosis not present

## 2022-03-13 DIAGNOSIS — L299 Pruritus, unspecified: Secondary | ICD-10-CM | POA: Diagnosis not present

## 2022-03-13 DIAGNOSIS — Z8719 Personal history of other diseases of the digestive system: Secondary | ICD-10-CM

## 2022-03-13 DIAGNOSIS — D649 Anemia, unspecified: Secondary | ICD-10-CM

## 2022-03-13 LAB — CBC
HCT: 22.1 % — CL (ref 39.0–52.0)
Hemoglobin: 7 g/dL — CL (ref 13.0–17.0)
MCHC: 31.5 g/dL (ref 30.0–36.0)
MCV: 67 fl — ABNORMAL LOW (ref 78.0–100.0)
Platelets: 329 10*3/uL (ref 150.0–400.0)
RBC: 3.3 Mil/uL — ABNORMAL LOW (ref 4.22–5.81)
RDW: 20 % — ABNORMAL HIGH (ref 11.5–15.5)
WBC: 6.8 10*3/uL (ref 4.0–10.5)

## 2022-03-13 LAB — IBC + FERRITIN
Ferritin: 3.9 ng/mL — ABNORMAL LOW (ref 22.0–322.0)
Iron: 13 ug/dL — ABNORMAL LOW (ref 42–165)
Saturation Ratios: 2.6 % — ABNORMAL LOW (ref 20.0–50.0)
TIBC: 499.8 ug/dL — ABNORMAL HIGH (ref 250.0–450.0)
Transferrin: 357 mg/dL (ref 212.0–360.0)

## 2022-03-13 MED ORDER — OMEPRAZOLE 40 MG PO CPDR: 40.0000 mg | DELAYED_RELEASE_CAPSULE | Freq: Every day | ORAL | 0 refills | Status: DC | PRN

## 2022-03-13 NOTE — Assessment & Plan Note (Signed)
Waxes and wanes with weather changes.   Continue clobetasol propionate 0.05% PRN.

## 2022-03-13 NOTE — Telephone Encounter (Signed)
[facility] called a critical lab result, HGB - 7.0 HCT - 22.1. Results given to [name] Phillips

## 2022-03-13 NOTE — Telephone Encounter (Signed)
Noted.  See result note.  

## 2022-03-13 NOTE — Patient Instructions (Signed)
Stop by the lab prior to leaving today. I will notify you of your results once received.   It was a pleasure to see you today!  Preventive Care 74-33 Years Old, Male Preventive care refers to lifestyle choices and visits with your health care provider that can promote health and wellness. Preventive care visits are also called wellness exams. What can I expect for my preventive care visit? Counseling During your preventive care visit, your health care provider may ask about your: Medical history, including: Past medical problems. Family medical history. Current health, including: Emotional well-being. Home life and relationship well-being. Sexual activity. Lifestyle, including: Alcohol, nicotine or tobacco, and drug use. Access to firearms. Diet, exercise, and sleep habits. Safety issues such as seatbelt and bike helmet use. Sunscreen use. Work and work Astronomer. Physical exam Your health care provider may check your: Height and weight. These may be used to calculate your BMI (body mass index). BMI is a measurement that tells if you are at a healthy weight. Waist circumference. This measures the distance around your waistline. This measurement also tells if you are at a healthy weight and may help predict your risk of certain diseases, such as type 2 diabetes and high blood pressure. Heart rate and blood pressure. Body temperature.  Vaccines are usually given at various ages, according to a schedule. Your health care provider will recommend vaccines for you based on your age, medical history, and lifestyle or other factors, such as travel or where you work. What tests do I need? Screening Your health care provider may recommend screening tests for certain conditions. This may include: Lipid and cholesterol levels. Diabetes screening. This is done by checking your blood sugar (glucose) after you have not eaten for a while (fasting). Hepatitis B test. Hepatitis C test. HIV  (human immunodeficiency virus) test. STI (sexually transmitted infection) testing, if you are at risk. Talk with your health care provider about your test results, treatment options, and if necessary, the need for more tests. Follow these instructions at home: Eating and drinking  Eat a healthy diet that includes fresh fruits and vegetables, whole grains, lean protein, and low-fat dairy products. Drink enough fluid to keep your urine pale yellow. Take vitamin and mineral supplements as recommended by your health care provider. Do not drink alcohol if your health care provider tells you not to drink. If you drink alcohol: Limit how much you have to 0-2 drinks a day. Know how much alcohol is in your drink. In the [place] one drink equals one 12 oz bottle of beer (355 mL), one 5 oz glass of wine (148 mL), or one 1 oz glass of hard liquor (44 mL). Lifestyle Brush your teeth every morning and night with fluoride toothpaste. Floss one time each day. Exercise for at least 30 minutes 5 or more days each week. Do not use any products that contain nicotine or tobacco. These products include cigarettes, chewing tobacco, and vaping devices, such as e-cigarettes. If you need help quitting, ask your health care provider. Do not use drugs. If you are sexually active, practice safe sex. Use a condom or other form of protection to prevent STIs. Find healthy ways to manage stress, such as: Meditation, yoga, or listening to music. Journaling. Talking to a trusted person. Spending time with friends and family. Minimize exposure to UV radiation to reduce your risk of skin cancer. Safety Always wear your seat belt while driving or riding in a vehicle. Do not drive: If you have been  drinking alcohol. Do not ride with someone who has been drinking. If you have been using any mind-altering substances or drugs. While texting. When you are tired or distracted. Wear a helmet and other protective equipment during  sports activities. If you have firearms in your house, make sure you follow all gun safety procedures. Seek help if you have been physically or sexually abused. What's next? Go to your health care provider once a year for an annual wellness visit. Ask your health care provider how often you should have your eyes and teeth checked. Stay up to date on all vaccines. This information is not intended to replace advice given to you by your health care provider. Make sure you discuss any questions you have with your health care provider. Document Revised: 01/24/2021 Document Reviewed: 01/24/2021 Elsevier Patient Education  2023 [employer].

## 2022-03-13 NOTE — Assessment & Plan Note (Signed)
Controlled.  Continue omeprazole 40 mg PRN.  Refills provided.

## 2022-03-13 NOTE — Progress Notes (Signed)
Subjective:    Patient ID: [name] Phillips, male    DOB: March 02, 1991, 31 y.o.   MRN: [id number]  HPI  [name] Phillips is a very pleasant 31 y.o. male who presents today for complete physical and follow up of chronic conditions.  Immunizations: -Tetanus: 2016 -Influenza: Due this season  -Covid-19: 2 vaccines  Diet: Fair diet.  Exercise: No regular exercise.  Eye exam: Completes annually  Dental exam: Completes semi-annually    BP Readings from Last 3 Encounters:  03/13/22 130/68  12/18/21 120/70  11/29/20 110/62       Review of Systems  Constitutional:  Negative for unexpected weight change.  HENT:  Negative for rhinorrhea.   Respiratory:  Negative for cough and shortness of breath.   Cardiovascular:  Negative for chest pain.  Gastrointestinal:  Negative for blood in stool, constipation and diarrhea.  Genitourinary:  Negative for difficulty urinating.  Musculoskeletal:  Negative for arthralgias and myalgias.  Skin:  Negative for rash.  Allergic/Immunologic: Negative for environmental allergies.  Neurological:  Negative for dizziness and headaches.  Psychiatric/Behavioral:         Recent grief with the unexpected loss of his mother.          Past Medical History:  Diagnosis Date   Acute blood loss anemia 10/07/2013   GI bleed 10/05/2013   Herpes    PUD (peptic ulcer disease)     Social History   Socioeconomic History   Marital status: Single    Spouse name: Not on file   Number of children: Not on file   Years of education: Not on file   Highest education level: Not on file  Occupational History   Not on file  Tobacco Use   Smoking status: Never   Smokeless tobacco: Never  Substance and Sexual Activity   Alcohol use: Not Currently    Comment: rarely    Drug use: No   Sexual activity: Never  Other Topics Concern   Not on file  Social History Narrative   Single.   No children.   Works in [place].   Enjoys reading, playing video games,  exercise.   Social Determinants of Health   Financial Resource Strain: Not on file  Food Insecurity: Not on file  Transportation Needs: Not on file  Physical Activity: Not on file  Stress: Not on file  Social Connections: Not on file  Intimate Partner Violence: Not on file    Past Surgical History:  Procedure Laterality Date   ESOPHAGOGASTRODUODENOSCOPY N/A 10/05/2013   Procedure: ESOPHAGOGASTRODUODENOSCOPY (EGD);  Surgeon: Theda Belfast, MD;  Location: [facility];  Service: Endoscopy;  Laterality: N/A;   ESOPHAGOGASTRODUODENOSCOPY ENDOSCOPY     WISDOM TOOTH EXTRACTION      Family History  Problem Relation Age of Onset   CAD Mother    Diabetes Mellitus II Mother    Stroke Mother    Prostate cancer Father    Hypertension Father    Asthma Brother     No Known Allergies  Current Outpatient Medications on File Prior to Visit  Medication Sig Dispense Refill   clobetasol (TEMOVATE) 0.05 % external solution Apply 1 application. topically 2 (two) times daily. 50 mL 0   valACYclovir (VALTREX) 500 MG tablet Take 1 tablet by mouth twice daily for three days as needed for outbreaks. 6 tablet 1   No current facility-administered medications on file prior to visit.    BP 130/68   Pulse 88   Temp 98.6 F (37 C) (  Oral)   Ht 6\' 1"  (1.854 m)   Wt 251 lb (113.9 kg)   SpO2 99%   BMI 33.12 kg/m  Objective:   Physical Exam HENT:     Right Ear: Tympanic membrane and ear canal normal.     Left Ear: Tympanic membrane and ear canal normal.     Nose: Nose normal.     Right Sinus: No maxillary sinus tenderness or frontal sinus tenderness.     Left Sinus: No maxillary sinus tenderness or frontal sinus tenderness.  Eyes:     Conjunctiva/sclera: Conjunctivae normal.  Neck:     Thyroid: No thyromegaly.     Vascular: No carotid bruit.  Cardiovascular:     Rate and Rhythm: Normal rate and regular rhythm.     Heart sounds: Normal heart sounds.  Pulmonary:     Effort: Pulmonary  effort is normal.     Breath sounds: Normal breath sounds. No wheezing or rales.  Abdominal:     General: Bowel sounds are normal.     Palpations: Abdomen is soft.     Tenderness: There is no abdominal tenderness.  Musculoskeletal:        General: Normal range of motion.     Cervical back: Neck supple.  Skin:    General: Skin is warm and dry.  Neurological:     Mental Status: He is alert and oriented to person, place, and time.     Cranial Nerves: No cranial nerve deficit.     Deep Tendon Reflexes: Reflexes are normal and symmetric.  Psychiatric:        Mood and Affect: Mood normal.           Assessment & Plan:   Problem List Items Addressed This Visit       Digestive   GERD (gastroesophageal reflux disease)    Controlled.  Continue omeprazole 40 mg PRN.  Refills provided.       Relevant Medications   omeprazole (PRILOSEC) 40 MG capsule     Musculoskeletal and Integument   Scalp pruritus    Waxes and wanes with weather changes.   Continue clobetasol propionate 0.05% PRN.        Genitourinary   Genital herpes simplex    Infrequent outbreaks.  Continue Valtrex 500 mg PRN.        Other   Preventative health care - Primary    Immunizations UTD.  Discussed the importance of a healthy diet and regular exercise in order for weight loss, and to reduce the risk of further co-morbidity.  Exam stable. Labs pending.  Follow up in 1 year for repeat physical.       Anemia    Reviewed labs from May 2023 which appear to be secondary to iron deficiency anemia.   Given history of GI bleed we will repeat labs and consider stool cards if needed. He denies rectal bleeding/dark stools.      Relevant Orders   CBC   IBC + Ferritin   Other Visit Diagnoses     Screening for HIV (human immunodeficiency virus)       Relevant Orders   HIV antibody (with reflex)   Encounter for hepatitis C screening test for low risk patient       Relevant Orders   Hepatitis C  Antibody          June 2023, NP

## 2022-03-13 NOTE — Assessment & Plan Note (Signed)
Reviewed labs from May 2023 which appear to be secondary to iron deficiency anemia.   Given history of GI bleed we will repeat labs and consider stool cards if needed. He denies rectal bleeding/dark stools.

## 2022-03-13 NOTE — Assessment & Plan Note (Signed)
Immunizations UTD.  Discussed the importance of a healthy diet and regular exercise in order for weight loss, and to reduce the risk of further co-morbidity.  Exam stable. Labs pending.  Follow up in 1 year for repeat physical.  

## 2022-03-13 NOTE — Assessment & Plan Note (Signed)
Infrequent outbreaks.  Continue Valtrex 500 mg PRN. 

## 2022-03-14 ENCOUNTER — Other Ambulatory Visit: Payer: Self-pay

## 2022-03-14 DIAGNOSIS — D649 Anemia, unspecified: Secondary | ICD-10-CM

## 2022-03-14 DIAGNOSIS — Z8719 Personal history of other diseases of the digestive system: Secondary | ICD-10-CM

## 2022-03-14 LAB — HEPATITIS C ANTIBODY: Hepatitis C Ab: NONREACTIVE

## 2022-03-14 LAB — HIV ANTIBODY (ROUTINE TESTING W REFLEX): HIV 1&2 Ab, 4th Generation: NONREACTIVE

## 2022-03-15 LAB — FECAL OCCULT BLOOD, IMMUNOCHEMICAL: Fecal Occult Bld: NEGATIVE

## 2022-03-19 ENCOUNTER — Encounter: Payer: Self-pay | Admitting: Gastroenterology

## 2022-03-19 ENCOUNTER — Ambulatory Visit (INDEPENDENT_AMBULATORY_CARE_PROVIDER_SITE_OTHER): Payer: BC Managed Care – PPO | Admitting: Gastroenterology

## 2022-03-19 ENCOUNTER — Other Ambulatory Visit: Payer: Self-pay

## 2022-03-19 VITALS — BP 157/100 | HR 102 | Temp 98.9°F | Ht 73.0 in | Wt 255.5 lb

## 2022-03-19 DIAGNOSIS — K625 Hemorrhage of anus and rectum: Secondary | ICD-10-CM

## 2022-03-19 DIAGNOSIS — Z8719 Personal history of other diseases of the digestive system: Secondary | ICD-10-CM

## 2022-03-19 DIAGNOSIS — Z8711 Personal history of peptic ulcer disease: Secondary | ICD-10-CM | POA: Diagnosis not present

## 2022-03-19 DIAGNOSIS — D509 Iron deficiency anemia, unspecified: Secondary | ICD-10-CM | POA: Diagnosis not present

## 2022-03-19 DIAGNOSIS — K921 Melena: Secondary | ICD-10-CM

## 2022-03-19 MED ORDER — NA SULFATE-K SULFATE-MG SULF 17.5-3.13-1.6 GM/177ML PO SOLN: 354.0000 mL | Freq: Once | ORAL | 0 refills | Status: AC

## 2022-03-19 MED ORDER — FUSION PLUS PO CAPS: 1.0000 | ORAL_CAPSULE | Freq: Every day | ORAL | 3 refills | Status: AC

## 2022-03-19 NOTE — Progress Notes (Unsigned)
[name] Repress, MD [address]  [address]  [place], [place] [address]  Main: [phone]  Fax: [phone]    Gastroenterology Consultation  Referring Provider:     Doreene Nest, NP Primary Care Physician:  Doreene Nest, NP Primary Gastroenterologist:  Dr. Arlyss Phillips Reason for Consultation: Dark stools, iron deficiency anemia        HPI:   [name] Phillips is a 31 y.o. male referred by Dr. Chestine Spore, Keane Scrape, NP  for consultation & management of dark stools, severe iron deficiency anemia.  Patient reports that about 2 to 3 weeks ago, he noticed that his stools were black as well as maroon-colored.  He reports fatigue. Patient has known history of GI bleed in 2015 secondary to peptic ulcer disease.  Patient was originally seen by PCP in May 2023 secondary to right lower back pain, right upper, retrosternal pain for 3 weeks which was worse with eating.  He was started on omeprazole 40 mg daily for 1 month.  Labs revealed hemoglobin 11.4, MCV 65.8.  His mother recently passed away and going through grief response.  Labs were repeated which showed significant drop in hemoglobin to 7, serum ferritin 3.9.  FOBT was negative.  Urgent referral to GI was placed.  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  Denies any dizziness or lightheadedness.  He is not taking any iron, taking omeprazole as needed only.  Patient does not smoke or drink alcohol Referral to heme-onc, Dr. Donneta Romberg was placed, has an appointment to see tomorrow   NSAIDs: None  Antiplts/Anticoagulants/Anti thrombotics: None  GI Procedures:  EGD in 2015, 1 cm gastric antral ulcer treated with cautery as well as clips  Past Medical History:  Diagnosis Date   Acute blood loss anemia 10/07/2013   GI bleed 10/05/2013   Herpes    PUD (peptic ulcer disease)     Past Surgical History:  Procedure Laterality Date   ESOPHAGOGASTRODUODENOSCOPY N/A 10/05/2013   Procedure:  ESOPHAGOGASTRODUODENOSCOPY (EGD);  Surgeon: Theda Belfast, MD;  Location: [facility];  Service: Endoscopy;  Laterality: N/A;   ESOPHAGOGASTRODUODENOSCOPY ENDOSCOPY     WISDOM TOOTH EXTRACTION       Current Outpatient Medications:    clobetasol (TEMOVATE) 0.05 % external solution, Apply 1 application. topically 2 (two) times daily., Disp: 50 mL, Rfl: 0   Iron-FA-B Cmp-C-Biot-Probiotic (FUSION PLUS) CAPS, Take 1 capsule by mouth daily. (Patient not taking: Reported on 03/20/2022), Disp: 30 capsule, Rfl: 3   omeprazole (PRILOSEC) 40 MG capsule, Take 1 capsule (40 mg total) by mouth daily as needed. For heartburn, Disp: 90 capsule, Rfl: 0   valACYclovir (VALTREX) 500 MG tablet, Take 1 tablet by mouth twice daily for three days as needed for outbreaks., Disp: 6 tablet, Rfl: 1   Family History  Problem Relation Age of Onset   Diabetes Mother    CAD Mother    Diabetes Mellitus II Mother    Stroke Mother    Anemia Mother        possible blood transfusion   Rectal cancer Father        2021- in a polyp s/p resection   Prostate cancer Father        > 10 years ago- [place], [place]   Hypertension Father    Asthma Brother    Stomach cancer Paternal Grandfather    Anemia Paternal Aunt        requiring blood transfusion     Social History  Tobacco Use   Smoking status: Never   Smokeless tobacco: Never  Substance Use Topics   Alcohol use: Not Currently    Comment: rarely    Drug use: No    Allergies as of 03/19/2022   (No Known Allergies)    Review of Systems:    All systems reviewed and negative except where noted in HPI.   Physical Exam:  BP (!) 157/100 (BP Location: Left Arm, Patient Position: Sitting, Cuff Size: Normal)   Pulse (!) 102   Temp 98.9 F (37.2 C) (Oral)   Ht 6\' 1"  (1.854 m)   Wt 255 lb 8 oz (115.9 kg)   BMI 33.71 kg/m  No LMP for male patient.  General:   Alert,  Well-developed, well-nourished, pleasant and cooperative in NAD Head:  Normocephalic and  atraumatic. Eyes:  Sclera clear, no icterus.   Conjunctiva pink. Ears:  Normal auditory acuity. Nose:  No deformity, discharge, or lesions. Mouth:  No deformity or lesions,oropharynx pink & moist. Neck:  Supple; no masses or thyromegaly. Lungs:  Respirations even and unlabored.  Clear throughout to auscultation.   No wheezes, crackles, or rhonchi. No acute distress. Heart:  Regular rate and rhythm; no murmurs, clicks, rubs, or gallops. Abdomen:  Normal bowel sounds. Soft, non-tender and non-distended without masses, hepatosplenomegaly or hernias noted.  No guarding or rebound tenderness.   Rectal: Not performed Msk:  Symmetrical without gross deformities. Good, equal movement & strength bilaterally. Pulses:  Normal pulses noted. Extremities:  No clubbing or edema.  No cyanosis. Neurologic:  Alert and oriented x3;  grossly normal neurologically. Skin:  Intact without significant lesions or rashes. No jaundice. Psych:  Alert and cooperative. Normal mood and affect.  Imaging Studies: Reviewed  Assessment and Plan:   [name] Phillips is a 31 y.o. African-American male with history of peptic ulcer disease is seen in consultation for severe iron deficiency anemia, dark stools 2 weeks ago  Recommend to continue omeprazole 40 mg daily before breakfast Recheck CBC today, if still low at 7 or below, recommend blood transfusion Recommend urgent upper endoscopy and colonoscopy to evaluate the source of iron deficiency anemia.  Patient is scheduled to undergo these procedures on 03/22/2022 Follow-up with Dr. 05/22/2022, heme-onc  I have discussed alternative options, risks & benefits,  which include, but are not limited to, bleeding, infection, perforation,respiratory complication & drug reaction.  The patient agrees with this plan & written consent will be obtained.     Follow up based on the above workup   Donneta Romberg, MD

## 2022-03-20 ENCOUNTER — Telehealth: Payer: Self-pay

## 2022-03-20 ENCOUNTER — Encounter: Payer: Self-pay | Admitting: Gastroenterology

## 2022-03-20 ENCOUNTER — Inpatient Hospital Stay: Payer: BC Managed Care – PPO

## 2022-03-20 ENCOUNTER — Inpatient Hospital Stay: Payer: BC Managed Care – PPO | Attending: Internal Medicine | Admitting: Internal Medicine

## 2022-03-20 ENCOUNTER — Encounter: Payer: Self-pay | Admitting: Internal Medicine

## 2022-03-20 DIAGNOSIS — D649 Anemia, unspecified: Secondary | ICD-10-CM

## 2022-03-20 DIAGNOSIS — D509 Iron deficiency anemia, unspecified: Secondary | ICD-10-CM | POA: Insufficient documentation

## 2022-03-20 DIAGNOSIS — Z79899 Other long term (current) drug therapy: Secondary | ICD-10-CM | POA: Insufficient documentation

## 2022-03-20 DIAGNOSIS — Z800 Family history of malignant neoplasm of digestive organs: Secondary | ICD-10-CM | POA: Insufficient documentation

## 2022-03-20 LAB — CBC WITH DIFFERENTIAL/PLATELET
Abs Immature Granulocytes: 0.02 10*3/uL (ref 0.00–0.07)
Basophils Absolute: 0.1 10*3/uL (ref 0.0–0.1)
Basophils Relative: 1 %
Eosinophils Absolute: 0.1 10*3/uL (ref 0.0–0.5)
Eosinophils Relative: 2 %
HCT: 24.8 % — ABNORMAL LOW (ref 39.0–52.0)
Hemoglobin: 7.2 g/dL — ABNORMAL LOW (ref 13.0–17.0)
Immature Granulocytes: 0 %
Lymphocytes Relative: 24 %
Lymphs Abs: 1.7 10*3/uL (ref 0.7–4.0)
MCH: 20.4 pg — ABNORMAL LOW (ref 26.0–34.0)
MCHC: 29 g/dL — ABNORMAL LOW (ref 30.0–36.0)
MCV: 70.3 fL — ABNORMAL LOW (ref 80.0–100.0)
Monocytes Absolute: 0.7 10*3/uL (ref 0.1–1.0)
Monocytes Relative: 10 %
Neutro Abs: 4.5 10*3/uL (ref 1.7–7.7)
Neutrophils Relative %: 63 %
Platelets: 445 10*3/uL — ABNORMAL HIGH (ref 150–400)
RBC: 3.53 MIL/uL — ABNORMAL LOW (ref 4.22–5.81)
RDW: 18.3 % — ABNORMAL HIGH (ref 11.5–15.5)
WBC: 7.1 10*3/uL (ref 4.0–10.5)
nRBC: 0 % (ref 0.0–0.2)

## 2022-03-20 LAB — COMPREHENSIVE METABOLIC PANEL
ALT: 20 U/L (ref 0–44)
AST: 27 U/L (ref 15–41)
Albumin: 3.8 g/dL (ref 3.5–5.0)
Alkaline Phosphatase: 51 U/L (ref 38–126)
Anion gap: 7 (ref 5–15)
BUN: 13 mg/dL (ref 6–20)
CO2: 27 mmol/L (ref 22–32)
Calcium: 8.8 mg/dL — ABNORMAL LOW (ref 8.9–10.3)
Chloride: 103 mmol/L (ref 98–111)
Creatinine, Ser: 1.14 mg/dL (ref 0.61–1.24)
GFR, Estimated: >60 mL/min (ref >60)
Glucose, Bld: 89 mg/dL (ref 70–99)
Potassium: 3.9 mmol/L (ref 3.5–5.1)
Sodium: 137 mmol/L (ref 135–145)
Total Bilirubin: 0.3 mg/dL (ref 0.3–1.2)
Total Protein: 7.3 g/dL (ref 6.5–8.1)

## 2022-03-20 LAB — RETICULOCYTES
Immature Retic Fract: 25.1 % — ABNORMAL HIGH (ref 2.3–15.9)
RBC.: 3.49 MIL/uL — ABNORMAL LOW (ref 4.22–5.81)
Retic Count, Absolute: 75.4 10*3/uL (ref 19.0–186.0)
Retic Ct Pct: 2.2 % (ref 0.4–3.1)

## 2022-03-20 LAB — SAMPLE TO BLOOD BANK

## 2022-03-20 LAB — CBC
Hematocrit: 23.5 % — ABNORMAL LOW (ref 37.5–51.0)
Hemoglobin: 7 g/dL — CL (ref 13.0–17.7)
MCH: 20.4 pg — ABNORMAL LOW (ref 26.6–33.0)
MCHC: 29.8 g/dL — ABNORMAL LOW (ref 31.5–35.7)
MCV: 69 fL — ABNORMAL LOW (ref 79–97)
Platelets: 491 10*3/uL — ABNORMAL HIGH (ref 150–450)
RBC: 3.43 x10E6/uL — ABNORMAL LOW (ref 4.14–5.80)
RDW: 17.4 % — ABNORMAL HIGH (ref 11.6–15.4)
WBC: 8.6 10*3/uL (ref 3.4–10.8)

## 2022-03-20 LAB — B12 AND FOLATE PANEL
Folate: >20 ng/mL (ref >3.0)
Vitamin B-12: 420 pg/mL (ref 232–1245)

## 2022-03-20 LAB — PREPARE RBC (CROSSMATCH)

## 2022-03-20 LAB — LACTATE DEHYDROGENASE: LDH: 160 U/L (ref 98–192)

## 2022-03-20 LAB — ABO/RH: ABO/RH(D): O POS

## 2022-03-20 NOTE — Telephone Encounter (Signed)
Patient verbalized understanding and states he will see haematology today

## 2022-03-20 NOTE — Telephone Encounter (Signed)
During check out at today's office visit Mrs. Miyasato requested a work note for future appointments sent to his Mychart .  Dr. B approves a give work note be provided stating he will have once a week appts for infusion.  Work note has been ented (letter section of chart) for patient to review/retrieve in MyChart.

## 2022-03-20 NOTE — Progress Notes (Signed)
New patient evaluation accompanied by spouse.  History of of anemia not requiring blood transfusion or IV iron.  Scheduled for colonoscopy and EGD on 03/22/22.  Family history of mother and aunt diagnosed with hereditary blood disorder.

## 2022-03-20 NOTE — Telephone Encounter (Signed)
-----   Message from Toney Reil, MD sent at 03/20/2022  8:32 AM EDT ----- Repeat hemoglobin is still at 7.  I see that he has an appointment with Dr. Donneta Romberg today.  He probably will need 1 unit of PRBCs and IV iron  Morrie Sheldon  Please inform patient that his hemoglobin is still low and he should not miss appointment with heme-onc  Thanks RV

## 2022-03-20 NOTE — Assessment & Plan Note (Addendum)
#   Severe anemia hemoglobin 7 microcytic iron deficiency-given the severity of anemia recommend IV iron infusion.   Discussed the potential acute infusion reactions with IV iron; which are quite rare.  Patient understands the risk; will proceed with infusions.  Given the severe anemia recommend monitor PRBC transfusion- on 8/10; pros and cons were discussed patient agreement.  Also discussed with Dr. Allegra Lai.  #Etiology of iron deficiency: Unclear; I had a long discussion with the patient regarding multiple etiologies of anemia including iron deficiency-which is mainly caused by blood loss/malabsorption.  Prior history of "ulcers" as per patient.  Patient status post GI evaluation-EGD colonoscopy- on 8/11.  # Family Hx of cancer- father- rectal & prostate cancet at 32y [not genetically tested-confirmed with pt's mother]; paternal grand mother- stomach cancer. Consider genetics down the line for pt/father.   Thank you, Ms.Clark for allowing me to participate in the care of your pleasant patient. Please do not hesitate to contact me with questions or concerns in the interim.  Discussed with Dr. Allegra Lai.  # DISPOSITION: # labs today- ordered # Possible 1 unit PRBC tomorrow # venofer weekly x 3- start ASAP # follow up in 6 weeks MD: labs- cbc;possible venofer-Dr.B

## 2022-03-20 NOTE — Progress Notes (Signed)
[name] Phillips NOTE  Patient Care Team: Pleas Koch, NP as PCP - General (Internal Medicine) Cammie Sickle, MD as Consulting Physician (Oncology)  CHIEF COMPLAINTS/PURPOSE OF CONSULTATION: ANEMIA   HEMATOLOGY HISTORY  # ANEMIA [Hb; MCV-platelets- WBC; Iron sat; ferritin;  GFR- CT/US- ;  EGD/colonoscopy- [2015]; again EGD/colonoscopy on 03/22/2022 [Dr.vanga]    Latest Reference Range & Units 03/13/22 12:29  Iron 42 - 165 ug/dL 13 (L)  TIBC 250.0 - 450.0 mcg/dL 499.8 (H)  Saturation Ratios 20.0 - 50.0 % 2.6 (L)  Ferritin 22.0 - 322.0 ng/mL 3.9 (L)  Transferrin 212.0 - 360.0 mg/dL 357.0  (L): Data is abnormally low (H): Data is abnormally high    Latest Reference Range & Units 03/19/22 15:12  WBC 3.4 - 10.8 x10E3/uL 8.6  RBC 4.14 - 5.80 x10E6/uL 3.43 (L)  Hemoglobin 13.0 - 17.7 g/dL 7.0 (LL)  HCT 37.5 - 51.0 % 23.5 (L)  MCV 79 - 97 fL 69 (L)  MCH 26.6 - 33.0 pg 20.4 (L)  MCHC 31.5 - 35.7 g/dL 29.8 (L)  RDW 11.6 - 15.4 % 17.4 (H)  Platelets 150 - 450 x10E3/uL 491 (H)  (LL): Data is critically low (L): Data is abnormally low (H): Data is abnormally high  HISTORY OF PRESENTING ILLNESS: Ambulating; with significant other.   [name] Phillips 31 y.o.  male pleasant patient was been referred to [place] for further evaluation of anemia.  Prior history of "stomach ulcer" in 2015.  Patient s/p EGD x 2 in the past.  None recently.   Patient noted to have worsening stomach upset in the last few months.  Omeprazole seems to help with pain.  Not resolved.  Status post evaluation with GI; Dr.Vanga on 8/09. Awaiting EGD/Colo on 8/11.   Blood in stools: one episode of blood in end of July, 2023. No Hx of hemorrhoids. Blood in urine: none Difficulty swallowing: none Change of bowel movement/constipation:none  Prior blood transfusion: none Liver disease:none Alcohol:  social [1-2 /month]  Prior evaluation with hematology: none Prior bone marrow biopsy:   none Oral iron: not started on PO awaiting EGD.  Prior IV iron infusions:none    Review of Systems  Constitutional:  Positive for malaise/fatigue. Negative for chills, diaphoresis, fever and weight loss.  HENT:  Negative for nosebleeds and sore throat.   Eyes:  Negative for double vision.  Respiratory:  Negative for cough, hemoptysis, sputum production, shortness of breath and wheezing.   Cardiovascular:  Negative for chest pain, palpitations, orthopnea and leg swelling.  Gastrointestinal:  Positive for abdominal pain. Negative for blood in stool, constipation, diarrhea, heartburn, melena, nausea and vomiting.  Genitourinary:  Negative for dysuria, frequency and urgency.  Musculoskeletal:  Negative for back pain and joint pain.  Skin: Negative.  Negative for itching and rash.  Neurological:  Negative for dizziness, tingling, focal weakness, weakness and headaches.  Endo/Heme/Allergies:  Does not bruise/bleed easily.  Psychiatric/Behavioral:  Negative for depression. The patient is not nervous/anxious and does not have insomnia.      MEDICAL HISTORY:  Past Medical History:  Diagnosis Date   Acute blood loss anemia 10/07/2013   GI bleed 10/05/2013   Herpes    PUD (peptic ulcer disease)     SURGICAL HISTORY: Past Surgical History:  Procedure Laterality Date   ESOPHAGOGASTRODUODENOSCOPY N/A 10/05/2013   Procedure: ESOPHAGOGASTRODUODENOSCOPY (EGD);  Surgeon: Beryle Beams, MD;  Location: [facility];  Service: Endoscopy;  Laterality: N/A;   ESOPHAGOGASTRODUODENOSCOPY ENDOSCOPY     WISDOM  TOOTH EXTRACTION      SOCIAL HISTORY: Social History   Socioeconomic History   Marital status: Single    Spouse name: Not on file   Number of children: Not on file   Years of education: Not on file   Highest education level: Not on file  Occupational History   Not on file  Tobacco Use   Smoking status: Never   Smokeless tobacco: Never  Substance and Sexual Activity   Alcohol use: Not  Currently    Comment: rarely    Drug use: No   Sexual activity: Never  Other Topics Concern   Not on file  Social History Narrative   Lives in [place] area. Single; No children; Works in [place]; Enjoys reading, playing video games, exercise.   Social Determinants of Health   Financial Resource Strain: Not on file  Food Insecurity: Not on file  Transportation Needs: No Transportation Needs (03/20/2022)   PRAPARE - Hydrologist (Medical): No    Lack of Transportation (Non-Medical): No  Physical Activity: Not on file  Stress: Not on file  Social Connections: Not on file  Intimate Partner Violence: Not on file    FAMILY HISTORY: Family History  Problem Relation Age of Onset   Diabetes Mother    CAD Mother    Diabetes Mellitus II Mother    Stroke Mother    Anemia Mother        possible blood transfusion   Rectal cancer Father        2021- in a polyp s/p resection   Prostate cancer Father        > 10 years ago- [place], [place]   Hypertension Father    Asthma Brother    Stomach cancer Paternal Grandfather    Anemia Paternal Aunt        requiring blood transfusion    ALLERGIES:  has No Known Allergies.  MEDICATIONS:  Current Outpatient Medications  Medication Sig Dispense Refill   clobetasol (TEMOVATE) 0.05 % external solution Apply 1 application. topically 2 (two) times daily. 50 mL 0   omeprazole (PRILOSEC) 40 MG capsule Take 1 capsule (40 mg total) by mouth daily as needed. For heartburn 90 capsule 0   valACYclovir (VALTREX) 500 MG tablet Take 1 tablet by mouth twice daily for three days as needed for outbreaks. 6 tablet 1   Iron-FA-B Cmp-C-Biot-Probiotic (FUSION PLUS) CAPS Take 1 capsule by mouth daily. (Patient not taking: Reported on 03/20/2022) 30 capsule 3   No current facility-administered medications for this visit.     Marland Kitchen  PHYSICAL EXAMINATION:   There were no vitals filed for this visit. There were no vitals filed for this  visit.  Physical Exam Vitals and nursing note reviewed.  HENT:     Head: Normocephalic and atraumatic.     Mouth/Throat:     Pharynx: Oropharynx is clear.  Eyes:     Extraocular Movements: Extraocular movements intact.     Pupils: Pupils are equal, round, and reactive to light.  Cardiovascular:     Rate and Rhythm: Normal rate and regular rhythm.  Pulmonary:     Comments: Decreased breath sounds bilaterally.  Abdominal:     Palpations: Abdomen is soft.  Musculoskeletal:        General: Normal range of motion.     Cervical back: Normal range of motion.  Skin:    General: Skin is warm.  Neurological:     General: No focal deficit present.  Mental Status: He is alert and oriented to person, place, and time.  Psychiatric:        Behavior: Behavior normal.        Judgment: Judgment normal.      LABORATORY DATA:  I have reviewed the data as listed Lab Results  Component Value Date   WBC 7.1 03/20/2022   HGB 7.2 (L) 03/20/2022   HCT 24.8 (L) 03/20/2022   MCV 70.3 (L) 03/20/2022   PLT 445 (H) 03/20/2022   Recent Labs    12/18/21 0958 03/20/22 1230  NA 137 137  K 3.7 3.9  CL 102 103  CO2 27 27  GLUCOSE 90 89  BUN 11 13  CREATININE 1.21 1.14  CALCIUM 9.6 8.8*  GFRNONAA  --  >60  PROT 8.1 7.3  ALBUMIN 4.4 3.8  AST 30 27  ALT 24 20  ALKPHOS 71 51  BILITOT 0.5 0.3     No results found.  ASSESSMENT & PLAN:   Symptomatic anemia # Severe anemia hemoglobin 7 microcytic iron deficiency-given the severity of anemia recommend IV iron infusion.   Discussed the potential acute infusion reactions with IV iron; which are quite rare.  Patient understands the risk; will proceed with infusions.  Given the severe anemia recommend monitor PRBC transfusion- on 8/10; pros and cons were discussed patient agreement.  Also discussed with Dr. Marius Ditch.  #Etiology of iron deficiency: Unclear; I had a long discussion with the patient regarding multiple etiologies of anemia including  iron deficiency-which is mainly caused by blood loss/malabsorption.  Prior history of "ulcers" as per patient.  Patient status post GI evaluation-EGD colonoscopy- on 8/11.  # Family Hx of cancer- father- rectal & prostate cancet at 28y [not genetically tested-confirmed with pt's mother]; paternal grand mother- stomach cancer. Consider genetics down the line for pt/father.   Thank you, Ms.Clark for allowing me to participate in the care of your pleasant patient. Please do not hesitate to contact me with questions or concerns in the interim.  Discussed with Dr. Marius Ditch.  # DISPOSITION: # labs today- ordered # Possible 1 unit PRBC tomorrow # venofer weekly x 3- start ASAP # follow up in 6 weeks MD: labs- cbc;possible venofer-Dr.B    All questions were answered. The patient knows to call the clinic with any problems, questions or concerns.    Cammie Sickle, MD 03/20/2022 1:13 PM

## 2022-03-21 ENCOUNTER — Inpatient Hospital Stay: Payer: BC Managed Care – PPO

## 2022-03-21 DIAGNOSIS — D509 Iron deficiency anemia, unspecified: Secondary | ICD-10-CM | POA: Diagnosis not present

## 2022-03-21 DIAGNOSIS — D649 Anemia, unspecified: Secondary | ICD-10-CM

## 2022-03-21 LAB — HAPTOGLOBIN: Haptoglobin: 178 mg/dL (ref 17–317)

## 2022-03-21 MED ORDER — SODIUM CHLORIDE 0.9% IV SOLUTION
250.0000 mL | Freq: Once | INTRAVENOUS | Status: AC
  Administered 2022-03-21: 250 mL via INTRAVENOUS
  Filled 2022-03-21: qty 250

## 2022-03-21 MED ORDER — ACETAMINOPHEN 325 MG PO TABS
650.0000 mg | ORAL_TABLET | Freq: Once | ORAL | Status: AC
  Administered 2022-03-21: 650 mg via ORAL

## 2022-03-21 MED ORDER — DIPHENHYDRAMINE HCL 25 MG PO CAPS
25.0000 mg | ORAL_CAPSULE | Freq: Once | ORAL | Status: AC
  Administered 2022-03-21: 25 mg via ORAL

## 2022-03-22 ENCOUNTER — Ambulatory Visit
Admission: RE | Admit: 2022-03-22 | Discharge: 2022-03-22 | Disposition: A | Discharge status: Home / Self Care | Payer: BC Managed Care – PPO | Attending: Gastroenterology | Admitting: Gastroenterology

## 2022-03-22 ENCOUNTER — Encounter: Payer: Self-pay | Admitting: Gastroenterology

## 2022-03-22 ENCOUNTER — Ambulatory Visit: Payer: BC Managed Care – PPO | Admitting: General Practice

## 2022-03-22 ENCOUNTER — Encounter
Admission: RE | Disposition: A | Discharge status: Home / Self Care | Payer: Self-pay | Source: Home / Self Care | Attending: Gastroenterology

## 2022-03-22 DIAGNOSIS — K219 Gastro-esophageal reflux disease without esophagitis: Secondary | ICD-10-CM

## 2022-03-22 DIAGNOSIS — K269 Duodenal ulcer, unspecified as acute or chronic, without hemorrhage or perforation: Secondary | ICD-10-CM | POA: Diagnosis not present

## 2022-03-22 DIAGNOSIS — D509 Iron deficiency anemia, unspecified: Secondary | ICD-10-CM

## 2022-03-22 DIAGNOSIS — D500 Iron deficiency anemia secondary to blood loss (chronic): Secondary | ICD-10-CM

## 2022-03-22 DIAGNOSIS — K644 Residual hemorrhoidal skin tags: Secondary | ICD-10-CM | POA: Insufficient documentation

## 2022-03-22 DIAGNOSIS — K264 Chronic or unspecified duodenal ulcer with hemorrhage: Secondary | ICD-10-CM | POA: Insufficient documentation

## 2022-03-22 DIAGNOSIS — Z8619 Personal history of other infectious and parasitic diseases: Secondary | ICD-10-CM | POA: Diagnosis not present

## 2022-03-22 DIAGNOSIS — K625 Hemorrhage of anus and rectum: Secondary | ICD-10-CM

## 2022-03-22 DIAGNOSIS — K921 Melena: Secondary | ICD-10-CM

## 2022-03-22 HISTORY — PX: COLONOSCOPY WITH PROPOFOL: SHX5780

## 2022-03-22 HISTORY — PX: ESOPHAGOGASTRODUODENOSCOPY (EGD) WITH PROPOFOL: SHX5813

## 2022-03-22 LAB — TYPE AND SCREEN
ABO/RH(D): O POS
Antibody Screen: NEGATIVE
Unit division: 0

## 2022-03-22 LAB — BPAM RBC
Blood Product Expiration Date: 202309082359
ISSUE DATE / TIME: 202308100903
Unit Type and Rh: 5100

## 2022-03-22 SURGERY — COLONOSCOPY WITH PROPOFOL
Anesthesia: General

## 2022-03-22 MED ORDER — PHENYLEPHRINE 80 MCG/ML (10ML) SYRINGE FOR IV PUSH (FOR BLOOD PRESSURE SUPPORT)
PREFILLED_SYRINGE | INTRAVENOUS | Status: DC | PRN
  Administered 2022-03-22: 80 ug via INTRAVENOUS

## 2022-03-22 MED ORDER — GLYCOPYRROLATE 0.2 MG/ML IJ SOLN
INTRAMUSCULAR | Status: DC | PRN
  Administered 2022-03-22: .2 mg via INTRAVENOUS

## 2022-03-22 MED ORDER — PROPOFOL 500 MG/50ML IV EMUL
INTRAVENOUS | Status: DC | PRN
  Administered 2022-03-22: 150 ug/kg/min via INTRAVENOUS

## 2022-03-22 MED ORDER — PROPOFOL 1000 MG/100ML IV EMUL
INTRAVENOUS | Status: AC
  Filled 2022-03-22: qty 100

## 2022-03-22 MED ORDER — OMEPRAZOLE 40 MG PO CPDR: 40.0000 mg | DELAYED_RELEASE_CAPSULE | Freq: Two times a day (BID) | ORAL | 1 refills | Status: AC

## 2022-03-22 MED ORDER — SODIUM CHLORIDE 0.9 % IV SOLN
INTRAVENOUS | Status: DC
  Administered 2022-03-22: 1000 mL via INTRAVENOUS

## 2022-03-22 MED ORDER — STERILE WATER FOR IRRIGATION IR SOLN
Status: DC | PRN
  Administered 2022-03-22: 60 mL

## 2022-03-22 MED ORDER — PROPOFOL 10 MG/ML IV BOLUS
INTRAVENOUS | Status: DC | PRN
  Administered 2022-03-22 (×3): 100 mg via INTRAVENOUS

## 2022-03-22 MED ORDER — LIDOCAINE HCL (CARDIAC) PF 100 MG/5ML IV SOSY
PREFILLED_SYRINGE | INTRAVENOUS | Status: DC | PRN
  Administered 2022-03-22: 100 mg via INTRAVENOUS

## 2022-03-22 MED ORDER — DEXMEDETOMIDINE (PRECEDEX) IN NS 20 MCG/5ML (4 MCG/ML) IV SYRINGE
PREFILLED_SYRINGE | INTRAVENOUS | Status: DC | PRN
  Administered 2022-03-22: 12 ug via INTRAVENOUS

## 2022-03-22 NOTE — Op Note (Signed)
[facility] Gastroenterology Patient Name: [name] Phillips Procedure Date: 03/22/2022 9:16 AM MRN: [id number] Account #: [id number] Date of Birth: October 28, 1990 Admit Type: Outpatient Age: 31 Room: [facility] ENDO ROOM 2 Gender: Male Note Status: Finalized Instrument Name: Upper Endoscope [id number] Procedure:             Upper GI endoscopy Indications:           Suspected upper gastrointestinal bleeding in patient                         with unexplained iron deficiency anemia, Hematochezia Providers:             Toney Reil MD, MD Medicines:             General Anesthesia Complications:         No immediate complications. Estimated blood loss: None. Procedure:             Pre-Anesthesia Assessment:                        - Prior to the procedure, a History and Physical was                         performed, and patient medications and allergies were                         reviewed. The patient is competent. The risks and                         benefits of the procedure and the sedation options and                         risks were discussed with the patient. All questions                         were answered and informed consent was obtained.                         Patient identification and proposed procedure were                         verified by the physician, the nurse, the                         anesthesiologist, the anesthetist and the technician                         in the pre-procedure area in the procedure room in the                         endoscopy suite. Mental Status Examination: alert and                         oriented. Airway Examination: normal oropharyngeal                         airway and neck mobility. Respiratory Examination:  clear to auscultation. CV Examination: normal.                         Prophylactic Antibiotics: The patient does not require                         prophylactic antibiotics. Prior  Anticoagulants: The                         patient has taken no previous anticoagulant or                         antiplatelet agents. ASA Grade Assessment: II - A                         patient with mild systemic disease. After reviewing                         the risks and benefits, the patient was deemed in                         satisfactory condition to undergo the procedure. The                         anesthesia plan was to use general anesthesia.                         Immediately prior to administration of medications,                         the patient was re-assessed for adequacy to receive                         sedatives. The heart rate, respiratory rate, oxygen                         saturations, blood pressure, adequacy of pulmonary                         ventilation, and response to care were monitored                         throughout the procedure. The physical status of the                         patient was re-assessed after the procedure.                        After obtaining informed consent, the endoscope was                         passed under direct vision. Throughout the procedure,                         the patient's blood pressure, pulse, and oxygen                         saturations were monitored continuously. The Endoscope  was introduced through the mouth, and advanced to the                         second part of duodenum. The upper GI endoscopy was                         accomplished without difficulty. The patient tolerated                         the procedure well. Findings:      One non-bleeding superficial duodenal ulcer with a clean ulcer base       (Forrest Class III) was found in the second portion of the duodenum. The       lesion was 20 mm in largest dimension.      The duodenal bulb was normal.      The entire examined stomach was normal. Biopsies were taken with a cold       forceps for Helicobacter  pylori testing.      The cardia and gastric fundus were normal on retroflexion.      The gastroesophageal junction and examined esophagus were normal. Impression:            - Non-bleeding duodenal ulcer with a clean ulcer base                         (Forrest Class III).                        - Normal duodenal bulb.                        - Normal stomach. Biopsied.                        - Normal gastroesophageal junction and esophagus. Recommendation:        - Await pathology results.                        - Use Prilosec (omeprazole) 40 mg PO BID for 3 months.                        - Return to my office as previously scheduled.                        - Proceed with colonoscopy as scheduled                        See colonoscopy report Procedure Code(s):     --- Professional ---                        [id number], Esophagogastroduodenoscopy, flexible,                         transoral; with biopsy, single or multiple Diagnosis Code(s):     --- Professional ---                        K26.9, Duodenal ulcer, unspecified as acute or  chronic, without hemorrhage or perforation                        D50.9, Iron deficiency anemia, unspecified                        K92.1, Melena (includes Hematochezia) CPT copyright 2019 American Medical Association. All rights reserved. The codes documented in this report are preliminary and upon coder review may  be revised to meet current compliance requirements. Dr. Libby Maw Toney Reil MD, MD 03/22/2022 9:39:59 AM This report has been signed electronically. Number of Addenda: 0 Note Initiated On: 03/22/2022 9:16 AM Estimated Blood Loss:  Estimated blood loss: none.      [facility]

## 2022-03-22 NOTE — Anesthesia Preprocedure Evaluation (Signed)
Anesthesia Evaluation  Patient identified by MRN, date of birth, ID band Patient awake    Reviewed: Allergy & Precautions, NPO status , Patient's Chart, lab work & pertinent test results  History of Anesthesia Complications (+) AWARENESS UNDER ANESTHESIA and history of anesthetic complications  Airway Mallampati: II  TM Distance: >3 FB Neck ROM: full    Dental  (+) Teeth Intact   Pulmonary neg pulmonary ROS, neg shortness of breath, neg COPD,    Pulmonary exam normal        Cardiovascular (-) angina(-) Past MI and (-) CABG negative cardio ROS Normal cardiovascular exam(-) pacemaker     Neuro/Psych negative neurological ROS  negative psych ROS   GI/Hepatic negative GI ROS, Neg liver ROS,   Endo/Other  negative endocrine ROS  Renal/GU negative Renal ROS  negative genitourinary   Musculoskeletal   Abdominal   Peds  Hematology negative hematology ROS (+)   Anesthesia Other Findings Past Medical History: 10/07/2013: Acute blood loss anemia 10/05/2013: GI bleed No date: Herpes No date: PUD (peptic ulcer disease)  Past Surgical History: 10/05/2013: ESOPHAGOGASTRODUODENOSCOPY; N/A     Comment:  Procedure: ESOPHAGOGASTRODUODENOSCOPY (EGD);  Surgeon:               Theda Belfast, MD;  Location: [facility];  Service:               Endoscopy;  Laterality: N/A; No date: ESOPHAGOGASTRODUODENOSCOPY ENDOSCOPY No date: WISDOM TOOTH EXTRACTION  BMI    Body Mass Index: 32.61 kg/m      Reproductive/Obstetrics negative OB ROS                             Anesthesia Physical Anesthesia Plan  ASA: 2  Anesthesia Plan: General   Post-op Pain Management: Minimal or no pain anticipated   Induction: Intravenous  PONV Risk Score and Plan: 3 and Propofol infusion, TIVA and Ondansetron  Airway Management Planned: Nasal Cannula  Additional Equipment: None  Intra-op Plan:   Post-operative Plan:    Informed Consent: I have reviewed the patients History and Physical, chart, labs and discussed the procedure including the risks, benefits and alternatives for the proposed anesthesia with the patient or authorized representative who has indicated his/her understanding and acceptance.     Dental advisory given  Plan Discussed with: CRNA and Surgeon  Anesthesia Plan Comments: (Discussed risks of anesthesia with patient, including possibility of difficulty with spontaneous ventilation under anesthesia necessitating airway intervention, PONV, and rare risks such as cardiac or respiratory or neurological events, and allergic reactions. Discussed the role of CRNA in patient's perioperative care. Patient understands.)        Anesthesia Quick Evaluation

## 2022-03-22 NOTE — H&P (Signed)
Arlyss Repress, MD [address]  [address]  [place], [place] [address]  Main: [phone]  Fax: [phone] Pager: [phone]  Primary Care Physician:  Doreene Nest, NP Primary Gastroenterologist:  Dr. Arlyss Repress  Pre-Procedure History & Physical: HPI:  [name] Phillips is a 31 y.o. male is here for an endoscopy and colonoscopy.   Past Medical History:  Diagnosis Date   Acute blood loss anemia 10/07/2013   GI bleed 10/05/2013   Herpes    PUD (peptic ulcer disease)     Past Surgical History:  Procedure Laterality Date   ESOPHAGOGASTRODUODENOSCOPY N/A 10/05/2013   Procedure: ESOPHAGOGASTRODUODENOSCOPY (EGD);  Surgeon: Theda Belfast, MD;  Location: [facility];  Service: Endoscopy;  Laterality: N/A;   ESOPHAGOGASTRODUODENOSCOPY ENDOSCOPY     WISDOM TOOTH EXTRACTION      Prior to Admission medications   Medication Sig Start Date End Date Taking? Authorizing Provider  clobetasol (TEMOVATE) 0.05 % external solution Apply 1 application. topically 2 (two) times daily. 12/18/21  Yes Lynnda Child, MD  omeprazole (PRILOSEC) 40 MG capsule Take 1 capsule (40 mg total) by mouth daily as needed. For heartburn 03/13/22  Yes Doreene Nest, NP  Iron-FA-B Cmp-C-Biot-Probiotic (FUSION PLUS) CAPS Take 1 capsule by mouth daily. Patient not taking: Reported on 03/20/2022 03/19/22   Toney Reil, MD  valACYclovir (VALTREX) 500 MG tablet Take 1 tablet by mouth twice daily for three days as needed for outbreaks. 09/14/18   Doreene Nest, NP    Allergies as of 03/19/2022   (No Known Allergies)    Family History  Problem Relation Age of Onset   Diabetes Mother    CAD Mother    Diabetes Mellitus II Mother    Stroke Mother    Anemia Mother        possible blood transfusion   Rectal cancer Father        2021- in a polyp s/p resection   Prostate cancer Father        > 10 years ago- [place], [place]   Hypertension Father    Asthma Brother    Stomach cancer Paternal  Grandfather    Anemia Paternal Aunt        requiring blood transfusion    Social History   Socioeconomic History   Marital status: Significant Other    Spouse name: Not on file   Number of children: Not on file   Years of education: Not on file   Highest education level: Not on file  Occupational History   Not on file  Tobacco Use   Smoking status: Never   Smokeless tobacco: Never  Vaping Use   Vaping Use: Never used  Substance and Sexual Activity   Alcohol use: Not Currently    Comment: rarely    Drug use: No   Sexual activity: Never  Other Topics Concern   Not on file  Social History Narrative   Lives in [place] area. Single; No children; Works in [employer]; Enjoys reading, playing video games, exercise.   Social Determinants of Health   Financial Resource Strain: Not on file  Food Insecurity: Not on file  Transportation Needs: No Transportation Needs (03/20/2022)   PRAPARE - Administrator, Civil Service (Medical): No    Lack of Transportation (Non-Medical): No  Physical Activity: Not on file  Stress: Not on file  Social Connections: Not on file  Intimate Partner Violence: Not on file    Review of Systems: See  HPI, otherwise negative ROS  Physical Exam: BP (!) 144/77   Pulse 80   Temp (!) 97 F (36.1 C) (Temporal)   Resp 18   Ht 6\' 1"  (1.854 m)   Wt 112.1 kg   SpO2 100%   BMI 32.61 kg/m  General:   Alert,  pleasant and cooperative in NAD Head:  Normocephalic and atraumatic. Neck:  Supple; no masses or thyromegaly. Lungs:  Clear throughout to auscultation.    Heart:  Regular rate and rhythm. Abdomen:  Soft, nontender and nondistended. Normal bowel sounds, without guarding, and without rebound.   Neurologic:  Alert and  oriented x4;  grossly normal neurologically.  Impression/Plan: [name] Phillips is here for an endoscopy and colonoscopy to be performed for IDA  Risks, benefits, limitations, and alternatives regarding  endoscopy and  colonoscopy have been reviewed with the patient.  Questions have been answered.  All parties agreeable.   Hewitt Shorts, MD  03/22/2022, 9:19 AM

## 2022-03-22 NOTE — Transfer of Care (Signed)
Immediate Anesthesia Transfer of Care Note  Patient: [name] Phillips  Procedure(s) Performed: COLONOSCOPY WITH PROPOFOL ESOPHAGOGASTRODUODENOSCOPY (EGD) WITH PROPOFOL  Patient Location: PACU and Endoscopy Unit  Anesthesia Type:General  Level of Consciousness: drowsy  Airway & Oxygen Therapy: Patient Spontanous Breathing  Post-op Assessment: Report given to RN  Post vital signs: stable  Last Vitals:  Vitals Value Taken Time  BP    Temp    Pulse 73 03/22/22 0956  Resp 18 03/22/22 0956  SpO2 100 % 03/22/22 0956  Vitals shown include unvalidated device data.  Last Pain:  Vitals:   03/22/22 0909  TempSrc: Temporal  PainSc: 0-No pain         Complications: No notable events documented.

## 2022-03-22 NOTE — Op Note (Signed)
[name] Patient Name: [name] Phillips Procedure Date: 03/22/2022 9:16 AM MRN: [id number] Account #: [id number] Date of Birth: 03-May-1991 Admit Type: Outpatient Age: 31 Room: [facility] ENDO ROOM 2 Gender: Male Note Status: Finalized Instrument Name: Colonoscope [id number] Procedure:             Colonoscopy Indications:           This is the patient's first colonoscopy, Unexplained                         iron deficiency anemia Providers:             Toney Reil MD, MD Medicines:             General Anesthesia Complications:         No immediate complications. Estimated blood loss: None. Procedure:             Pre-Anesthesia Assessment:                        - Prior to the procedure, a History and Physical was                         performed, and patient medications and allergies were                         reviewed. The patient is competent. The risks and                         benefits of the procedure and the sedation options and                         risks were discussed with the patient. All questions                         were answered and informed consent was obtained.                         Patient identification and proposed procedure were                         verified by the physician, the nurse, the                         anesthesiologist, the anesthetist and the technician                         in the pre-procedure area in the procedure room in the                         endoscopy suite. Mental Status Examination: alert and                         oriented. Airway Examination: normal oropharyngeal                         airway and neck mobility. Respiratory Examination:                         clear to auscultation. CV  Examination: normal.                         Prophylactic Antibiotics: The patient does not require                         prophylactic antibiotics. Prior Anticoagulants: The                          patient has taken no previous anticoagulant or                         antiplatelet agents. ASA Grade Assessment: II - A                         patient with mild systemic disease. After reviewing                         the risks and benefits, the patient was deemed in                         satisfactory condition to undergo the procedure. The                         anesthesia plan was to use general anesthesia.                         Immediately prior to administration of medications,                         the patient was re-assessed for adequacy to receive                         sedatives. The heart rate, respiratory rate, oxygen                         saturations, blood pressure, adequacy of pulmonary                         ventilation, and response to care were monitored                         throughout the procedure. The physical status of the                         patient was re-assessed after the procedure.                        After obtaining informed consent, the colonoscope was                         passed under direct vision. Throughout the procedure,                         the patient's blood pressure, pulse, and oxygen                         saturations were monitored continuously. The  Colonoscope was introduced through the anus and                         advanced to the the terminal ileum, with                         identification of the appendiceal orifice and IC                         valve. The colonoscopy was performed without                         difficulty. The patient tolerated the procedure well.                         The quality of the bowel preparation was evaluated                         using the BBPS [facility] Bowel Preparation Scale) with                         scores of: Right Colon = 3, Transverse Colon = 3 and                         Left Colon = 3 (entire mucosa seen well with no                          residual staining, small fragments of stool or opaque                         liquid). The total BBPS score equals 9. Findings:      The perianal and digital rectal examinations were normal. Pertinent       negatives include normal sphincter tone and no palpable rectal lesions.      The terminal ileum appeared normal.      The entire examined colon appeared normal.      Non-bleeding external hemorrhoids were found during retroflexion. The       hemorrhoids were medium-sized. Impression:            - The examined portion of the ileum was normal.                        - The entire examined colon is normal.                        - Non-bleeding external hemorrhoids.                        - No specimens collected. Recommendation:        - Discharge patient to home (with escort).                        - Resume previous diet today.                        - Continue present medications. Procedure Code(s):     --- Professional ---  44967, Colonoscopy, flexible; diagnostic, including                         collection of specimen(s) by brushing or washing, when                         performed (separate procedure) Diagnosis Code(s):     --- Professional ---                        K64.4, Residual hemorrhoidal skin tags                        D50.9, Iron deficiency anemia, unspecified CPT copyright 2019 American Medical Association. All rights reserved. The codes documented in this report are preliminary and upon coder review may  be revised to meet current compliance requirements. Dr. Libby Maw Toney Reil MD, MD 03/22/2022 9:53:59 AM This report has been signed electronically. Number of Addenda: 0 Note Initiated On: 03/22/2022 9:16 AM Scope Withdrawal Time: 0 hours 5 minutes 49 seconds  Total Procedure Duration: 0 hours 8 minutes 23 seconds  Estimated Blood Loss:  Estimated blood loss: none.      [facility]

## 2022-03-23 NOTE — Anesthesia Postprocedure Evaluation (Signed)
Anesthesia Post Note  Patient: [name] Phillips  Procedure(s) Performed: COLONOSCOPY WITH PROPOFOL ESOPHAGOGASTRODUODENOSCOPY (EGD) WITH PROPOFOL  Patient location during evaluation: Endoscopy Anesthesia Type: General Level of consciousness: awake and alert Pain management: pain level controlled Vital Signs Assessment: post-procedure vital signs reviewed and stable Respiratory status: spontaneous breathing, nonlabored ventilation, respiratory function stable and patient connected to nasal cannula oxygen Cardiovascular status: blood pressure returned to baseline and stable Postop Assessment: no apparent nausea or vomiting Anesthetic complications: no   No notable events documented.   Last Vitals:  Vitals:   03/22/22 1015 03/22/22 1022  BP: (!) 133/59 (!) 133/59  Pulse: 76 73  Resp: 16 14  Temp:  (!) 36.1 C  SpO2: 100% 100%    Last Pain:  Vitals:   03/22/22 1022  TempSrc:   PainSc: 0-No pain                 Stephanie Coup

## 2022-03-25 ENCOUNTER — Inpatient Hospital Stay: Payer: BC Managed Care – PPO

## 2022-03-25 VITALS — BP 130/58 | HR 72 | Temp 98.9°F | Resp 16

## 2022-03-25 DIAGNOSIS — D509 Iron deficiency anemia, unspecified: Secondary | ICD-10-CM | POA: Diagnosis not present

## 2022-03-25 DIAGNOSIS — D649 Anemia, unspecified: Secondary | ICD-10-CM

## 2022-03-25 LAB — SURGICAL PATHOLOGY

## 2022-03-25 MED ORDER — SODIUM CHLORIDE 0.9 % IV SOLN
200.0000 mg | Freq: Once | INTRAVENOUS | Status: AC
  Administered 2022-03-25: 200 mg via INTRAVENOUS
  Filled 2022-03-25: qty 200

## 2022-03-25 MED ORDER — SODIUM CHLORIDE 0.9 % IV SOLN
Freq: Once | INTRAVENOUS | Status: AC
  Filled 2022-03-25: qty 250

## 2022-04-01 ENCOUNTER — Inpatient Hospital Stay: Payer: BC Managed Care – PPO

## 2022-04-01 VITALS — BP 121/78 | HR 72 | Temp 98.0°F | Resp 16

## 2022-04-01 DIAGNOSIS — D649 Anemia, unspecified: Secondary | ICD-10-CM

## 2022-04-01 DIAGNOSIS — D509 Iron deficiency anemia, unspecified: Secondary | ICD-10-CM | POA: Diagnosis not present

## 2022-04-01 MED ORDER — SODIUM CHLORIDE 0.9 % IV SOLN
Freq: Once | INTRAVENOUS | Status: AC
  Filled 2022-04-01: qty 250

## 2022-04-01 MED ORDER — SODIUM CHLORIDE 0.9 % IV SOLN
200.0000 mg | Freq: Once | INTRAVENOUS | Status: AC
  Administered 2022-04-01: 200 mg via INTRAVENOUS
  Filled 2022-04-01: qty 200

## 2022-04-08 ENCOUNTER — Inpatient Hospital Stay: Payer: BC Managed Care – PPO

## 2022-04-08 VITALS — BP 124/69 | HR 59 | Temp 96.9°F | Resp 17

## 2022-04-08 DIAGNOSIS — D509 Iron deficiency anemia, unspecified: Secondary | ICD-10-CM | POA: Diagnosis not present

## 2022-04-08 DIAGNOSIS — D649 Anemia, unspecified: Secondary | ICD-10-CM

## 2022-04-08 MED ORDER — SODIUM CHLORIDE 0.9 % IV SOLN
200.0000 mg | Freq: Once | INTRAVENOUS | Status: AC
  Administered 2022-04-08: 200 mg via INTRAVENOUS
  Filled 2022-04-08: qty 200

## 2022-04-08 MED ORDER — SODIUM CHLORIDE 0.9 % IV SOLN
Freq: Once | INTRAVENOUS | Status: AC
  Filled 2022-04-08: qty 250

## 2022-04-30 ENCOUNTER — Ambulatory Visit: Payer: BC Managed Care – PPO | Admitting: Internal Medicine

## 2022-04-30 ENCOUNTER — Other Ambulatory Visit: Payer: BC Managed Care – PPO

## 2022-04-30 ENCOUNTER — Ambulatory Visit: Payer: BC Managed Care – PPO

## 2022-05-14 ENCOUNTER — Inpatient Hospital Stay: Payer: BC Managed Care – PPO | Attending: Internal Medicine

## 2022-05-14 ENCOUNTER — Inpatient Hospital Stay (HOSPITAL_BASED_OUTPATIENT_CLINIC_OR_DEPARTMENT_OTHER): Payer: BC Managed Care – PPO | Admitting: Nurse Practitioner

## 2022-05-14 ENCOUNTER — Inpatient Hospital Stay: Payer: BC Managed Care – PPO

## 2022-05-14 VITALS — BP 132/71 | HR 71 | Temp 97.9°F

## 2022-05-14 VITALS — BP 140/86 | HR 84 | Temp 98.7°F | Ht 74.0 in | Wt 254.0 lb

## 2022-05-14 DIAGNOSIS — Z800 Family history of malignant neoplasm of digestive organs: Secondary | ICD-10-CM | POA: Diagnosis not present

## 2022-05-14 DIAGNOSIS — Z79899 Other long term (current) drug therapy: Secondary | ICD-10-CM | POA: Diagnosis not present

## 2022-05-14 DIAGNOSIS — D500 Iron deficiency anemia secondary to blood loss (chronic): Secondary | ICD-10-CM | POA: Diagnosis not present

## 2022-05-14 DIAGNOSIS — D649 Anemia, unspecified: Secondary | ICD-10-CM | POA: Diagnosis present

## 2022-05-14 LAB — CBC WITH DIFFERENTIAL/PLATELET
Abs Immature Granulocytes: 0.01 K/uL (ref 0.00–0.07)
Basophils Absolute: 0 K/uL (ref 0.0–0.1)
Basophils Relative: 0 %
Eosinophils Absolute: 0.1 K/uL (ref 0.0–0.5)
Eosinophils Relative: 2 %
HCT: 43 % (ref 39.0–52.0)
Hemoglobin: 13.3 g/dL (ref 13.0–17.0)
Immature Granulocytes: 0 %
Lymphocytes Relative: 19 %
Lymphs Abs: 1.4 K/uL (ref 0.7–4.0)
MCH: 22.9 pg — ABNORMAL LOW (ref 26.0–34.0)
MCHC: 30.9 g/dL (ref 30.0–36.0)
MCV: 74 fL — ABNORMAL LOW (ref 80.0–100.0)
Monocytes Absolute: 0.6 K/uL (ref 0.1–1.0)
Monocytes Relative: 8 %
Neutro Abs: 5.2 K/uL (ref 1.7–7.7)
Neutrophils Relative %: 71 %
Platelets: 242 K/uL (ref 150–400)
RBC: 5.81 MIL/uL (ref 4.22–5.81)
RDW: 20.2 % — ABNORMAL HIGH (ref 11.5–15.5)
WBC: 7.4 K/uL (ref 4.0–10.5)
nRBC: 0 % (ref 0.0–0.2)

## 2022-05-14 MED ORDER — SODIUM CHLORIDE 0.9 % IV SOLN
200.0000 mg | Freq: Once | INTRAVENOUS | Status: AC
  Administered 2022-05-14: 200 mg via INTRAVENOUS
  Filled 2022-05-14: qty 200

## 2022-05-14 MED ORDER — SODIUM CHLORIDE 0.9 % IV SOLN
Freq: Once | INTRAVENOUS | Status: AC
  Filled 2022-05-14: qty 250

## 2022-05-14 NOTE — Patient Instructions (Signed)

## 2022-05-14 NOTE — Progress Notes (Signed)
[facility] CONSULT NOTE  Patient Care Team: Doreene Nest, NP as PCP - General (Internal Medicine) Earna Coder, MD as Consulting Physician (Oncology)  CHIEF COMPLAINTS/PURPOSE OF CONSULTATION: ANEMIA  HEMATOLOGY HISTORY:  # ANEMIA [Hb; MCV-platelets- WBC; Iron sat; ferritin;  GFR- CT/US- ;  EGD/colonoscopy- [2015]; again EGD/colonoscopy on 03/22/2022 [Dr.vanga]   Latest Reference Range & Units 03/13/22 12:29  Iron 42 - 165 ug/dL 13 (L)  TIBC 469.6 - 295.2 mcg/dL 841.3 (H)  Saturation Ratios 20.0 - 50.0 % 2.6 (L)  Ferritin 22.0 - 322.0 ng/mL 3.9 (L)  Transferrin 212.0 - 360.0 mg/dL 244.0  (L): Data is abnormally low (H): Data is abnormally high    Latest Reference Range & Units 03/19/22 15:12  WBC 3.4 - 10.8 x10E3/uL 8.6  RBC 4.14 - 5.80 x10E6/uL 3.43 (L)  Hemoglobin 13.0 - 17.7 g/dL 7.0 (LL)  HCT 10.2 - 72.5 % 23.5 (L)  MCV 79 - 97 fL 69 (L)  MCH 26.6 - 33.0 pg 20.4 (L)  MCHC 31.5 - 35.7 g/dL 36.6 (L)  RDW 44.0 - 34.7 % 17.4 (H)  Platelets 150 - 450 x10E3/uL 491 (H)  (LL): Data is critically low (L): Data is abnormally low (H): Data is abnormally high  HISTORY OF PRESENTING ILLNESS: Ambulating; with significant other.   [name] Phillips 31 y.o.  male pleasant patient with above history of anemia. Previous history of stomach ulcer in 2015. One episode of blood in stool in July 2023. In interim, he underwent colonoscopy and egd with Dr. Allegra Lai. He received IV Venofer x 3. He returns to clinic for follow up.      Review of Systems  Constitutional:  Positive for malaise/fatigue. Negative for chills, diaphoresis, fever and weight loss.  HENT:  Negative for nosebleeds and sore throat.   Eyes:  Negative for double vision.  Respiratory:  Negative for cough, hemoptysis, sputum production, shortness of breath and wheezing.   Cardiovascular:  Negative for chest pain, palpitations, orthopnea and leg swelling.  Gastrointestinal:  Positive for abdominal  pain. Negative for blood in stool, constipation, diarrhea, heartburn, melena, nausea and vomiting.  Genitourinary:  Negative for dysuria, frequency and urgency.  Musculoskeletal:  Negative for back pain and joint pain.  Skin: Negative.  Negative for itching and rash.  Neurological:  Negative for dizziness, tingling, focal weakness, weakness and headaches.  Endo/Heme/Allergies:  Does not bruise/bleed easily.  Psychiatric/Behavioral:  Negative for depression. The patient is not nervous/anxious and does not have insomnia.     MEDICAL HISTORY:  Past Medical History:  Diagnosis Date   Acute blood loss anemia 10/07/2013   GI bleed 10/05/2013   Herpes    PUD (peptic ulcer disease)     SURGICAL HISTORY: Past Surgical History:  Procedure Laterality Date   COLONOSCOPY WITH PROPOFOL N/A 03/22/2022   Procedure: COLONOSCOPY WITH PROPOFOL;  Surgeon: Toney Reil, MD;  Location: [facility] ENDOSCOPY;  Service: Gastroenterology;  Laterality: N/A;   ESOPHAGOGASTRODUODENOSCOPY N/A 10/05/2013   Procedure: ESOPHAGOGASTRODUODENOSCOPY (EGD);  Surgeon: Theda Belfast, MD;  Location: [facility];  Service: Endoscopy;  Laterality: N/A;   ESOPHAGOGASTRODUODENOSCOPY (EGD) WITH PROPOFOL N/A 03/22/2022   Procedure: ESOPHAGOGASTRODUODENOSCOPY (EGD) WITH PROPOFOL;  Surgeon: Toney Reil, MD;  Location: [facility] ENDOSCOPY;  Service: Gastroenterology;  Laterality: N/A;   ESOPHAGOGASTRODUODENOSCOPY ENDOSCOPY     WISDOM TOOTH EXTRACTION      SOCIAL HISTORY: Social History   Socioeconomic History   Marital status: Significant Other    Spouse name: Not on file  Number of children: Not on file   Years of education: Not on file   Highest education level: Not on file  Occupational History   Not on file  Tobacco Use   Smoking status: Never   Smokeless tobacco: Never  Vaping Use   Vaping Use: Never used  Substance and Sexual Activity   Alcohol use: Not Currently    Comment: rarely    Drug use: No   Sexual  activity: Never  Other Topics Concern   Not on file  Social History Narrative   Lives in [place] area. Single; No children; Works in [employer]; Enjoys reading, playing video games, exercise.   Social Determinants of Health   Financial Resource Strain: Not on file  Food Insecurity: Not on file  Transportation Needs: No Transportation Needs (03/20/2022)   PRAPARE - Hydrologist (Medical): No    Lack of Transportation (Non-Medical): No  Physical Activity: Not on file  Stress: Not on file  Social Connections: Not on file  Intimate Partner Violence: Not on file    FAMILY HISTORY: Family History  Problem Relation Age of Onset   Diabetes Mother    CAD Mother    Diabetes Mellitus II Mother    Stroke Mother    Anemia Mother        possible blood transfusion   Rectal cancer Father        2021- in a polyp s/p resection   Prostate cancer Father        > 10 years ago- [place], [place]   Hypertension Father    Asthma Brother    Stomach cancer Paternal Grandfather    Anemia Paternal Aunt        requiring blood transfusion    ALLERGIES:  has No Known Allergies.  MEDICATIONS:  Current Outpatient Medications  Medication Sig Dispense Refill   clobetasol (TEMOVATE) 0.05 % external solution Apply 1 application. topically 2 (two) times daily. 50 mL 0   omeprazole (PRILOSEC) 40 MG capsule Take 1 capsule (40 mg total) by mouth 2 (two) times daily before a meal. For heartburn 120 capsule 1   valACYclovir (VALTREX) 500 MG tablet Take 1 tablet by mouth twice daily for three days as needed for outbreaks. 6 tablet 1   Iron-FA-B Cmp-C-Biot-Probiotic (FUSION PLUS) CAPS Take 1 capsule by mouth daily. (Patient not taking: Reported on 03/20/2022) 30 capsule 3   No current facility-administered medications for this visit.     Marland Kitchen  PHYSICAL EXAMINATION: Vitals:   05/14/22 1341  BP: (!) 140/86  Pulse: 84  Temp: 98.7 F (37.1 C)  SpO2: 99%   Filed Weights   05/14/22  1341  Weight: 254 lb (115.2 kg)    Physical Exam Constitutional:      Appearance: He is not ill-appearing.  Eyes:     General: No scleral icterus. Cardiovascular:     Rate and Rhythm: Normal rate and regular rhythm.  Abdominal:     General: There is no distension.     Palpations: Abdomen is soft.     Tenderness: There is no abdominal tenderness. There is no guarding.  Musculoskeletal:        General: No deformity.     Right lower leg: No edema.     Left lower leg: No edema.  Lymphadenopathy:     Cervical: No cervical adenopathy.  Skin:    General: Skin is warm and dry.  Neurological:     Mental Status: He is alert and  oriented to person, place, and time. Mental status is at baseline.  Psychiatric:        Mood and Affect: Mood normal.        Behavior: Behavior normal.      LABORATORY DATA:  I have reviewed the data as listed Lab Results  Component Value Date   WBC 7.4 05/14/2022   HGB 13.3 05/14/2022   HCT 43.0 05/14/2022   MCV 74.0 (L) 05/14/2022   PLT 242 05/14/2022   Recent Labs    12/18/21 0958 03/20/22 1230  NA 137 137  K 3.7 3.9  CL 102 103  CO2 27 27  GLUCOSE 90 89  BUN 11 13  CREATININE 1.21 1.14  CALCIUM 9.6 8.8*  GFRNONAA  --  >60  PROT 8.1 7.3  ALBUMIN 4.4 3.8  AST 30 27  ALT 24 20  ALKPHOS 71 51  BILITOT 0.5 0.3  Iron/TIBC/Ferritin/ %Sat    Component Value Date/Time   IRON 13 (L) 03/13/2022 1229   TIBC 499.8 (H) 03/13/2022 1229   FERRITIN 3.9 (L) 03/13/2022 1229   IRONPCTSAT 2.6 (L) 03/13/2022 1229   No results found.  ASSESSMENT & PLAN:   No problem-specific Assessment & Plan notes found for this encounter.  Symptomatic anemia # Severe anemia - 03/2022- hmg 7 s/p 1 unit pRBCs and venofer x 3. Tolerated well. Hmg has improved to 13.3. Persistent microcytosis. Proceed with venofer today. He can continue oral iron for maintenance. Discussed iron rich foods.   # Etiology of iron deficiency- unclear. Blood loss vs malabsorption vs  others. S/p colonoscopy and egd. Ulcer on egd but negative for h.pylori, metaplasia, or dysplasia. Colon normal. Could consider capsule study.   # Family Hx of cancer- father- rectal & prostate cancet at 25y [not genetically tested-confirmed with pt's mother]; paternal grand mother- stomach cancer. Consider genetics down the line for pt/father.   # DISPOSITION: Venofer today  2 mo- labs (cbc, ferritin, iron studies), Dr. Donneta Romberg, +/- venofer- la  All questions were answered. The patient knows to call the clinic with any problems, questions or concerns.  Thank you for allowing me to participate in the care of this very pleasant patient.   Alinda Dooms, NP 05/14/2022

## 2022-07-12 MED FILL — Iron Sucrose Inj 20 MG/ML (Fe Equiv): INTRAVENOUS | Qty: 10 | Status: AC

## 2022-07-15 ENCOUNTER — Inpatient Hospital Stay: Payer: BC Managed Care – PPO

## 2022-07-15 ENCOUNTER — Inpatient Hospital Stay (HOSPITAL_BASED_OUTPATIENT_CLINIC_OR_DEPARTMENT_OTHER): Payer: BC Managed Care – PPO | Admitting: Internal Medicine

## 2022-07-15 ENCOUNTER — Encounter: Payer: Self-pay | Admitting: Internal Medicine

## 2022-07-15 ENCOUNTER — Inpatient Hospital Stay: Payer: BC Managed Care – PPO | Attending: Internal Medicine

## 2022-07-15 VITALS — BP 132/81 | HR 70 | Temp 98.6°F | Resp 20 | Wt 257.1 lb

## 2022-07-15 DIAGNOSIS — Z809 Family history of malignant neoplasm, unspecified: Secondary | ICD-10-CM

## 2022-07-15 DIAGNOSIS — D509 Iron deficiency anemia, unspecified: Secondary | ICD-10-CM | POA: Diagnosis present

## 2022-07-15 DIAGNOSIS — Z79624 Long term (current) use of inhibitors of nucleotide synthesis: Secondary | ICD-10-CM | POA: Insufficient documentation

## 2022-07-15 DIAGNOSIS — Z8711 Personal history of peptic ulcer disease: Secondary | ICD-10-CM | POA: Diagnosis not present

## 2022-07-15 DIAGNOSIS — D500 Iron deficiency anemia secondary to blood loss (chronic): Secondary | ICD-10-CM

## 2022-07-15 DIAGNOSIS — Z800 Family history of malignant neoplasm of digestive organs: Secondary | ICD-10-CM | POA: Diagnosis not present

## 2022-07-15 DIAGNOSIS — Z79899 Other long term (current) drug therapy: Secondary | ICD-10-CM | POA: Insufficient documentation

## 2022-07-15 DIAGNOSIS — D649 Anemia, unspecified: Secondary | ICD-10-CM | POA: Diagnosis not present

## 2022-07-15 LAB — CBC WITH DIFFERENTIAL/PLATELET
Abs Immature Granulocytes: 0.01 K/uL (ref 0.00–0.07)
Basophils Absolute: 0.1 K/uL (ref 0.0–0.1)
Basophils Relative: 1 %
Eosinophils Absolute: 0.3 K/uL (ref 0.0–0.5)
Eosinophils Relative: 3 %
HCT: 42.8 % (ref 39.0–52.0)
Hemoglobin: 13.6 g/dL (ref 13.0–17.0)
Immature Granulocytes: 0 %
Lymphocytes Relative: 27 %
Lymphs Abs: 2.2 K/uL (ref 0.7–4.0)
MCH: 23.5 pg — ABNORMAL LOW (ref 26.0–34.0)
MCHC: 31.8 g/dL (ref 30.0–36.0)
MCV: 74 fL — ABNORMAL LOW (ref 80.0–100.0)
Monocytes Absolute: 0.6 K/uL (ref 0.1–1.0)
Monocytes Relative: 8 %
Neutro Abs: 4.8 K/uL (ref 1.7–7.7)
Neutrophils Relative %: 61 %
Platelets: 261 K/uL (ref 150–400)
RBC: 5.78 MIL/uL (ref 4.22–5.81)
RDW: 17.8 % — ABNORMAL HIGH (ref 11.5–15.5)
WBC: 7.9 K/uL (ref 4.0–10.5)
nRBC: 0 % (ref 0.0–0.2)

## 2022-07-15 LAB — IRON AND TIBC
Iron: 77 ug/dL (ref 45–182)
Saturation Ratios: 19 % (ref 17.9–39.5)
TIBC: 407 ug/dL (ref 250–450)
UIBC: 330 ug/dL

## 2022-07-15 LAB — FERRITIN: Ferritin: 14 ng/mL — ABNORMAL LOW (ref 24–336)

## 2022-07-15 NOTE — Assessment & Plan Note (Addendum)
#   Severe anemia hemoglobin [AUG 2023] 7 microcytic iron deficiency-given the severity of anemia  s/p Venofer hemoglobin is 13.3.  Hold IV iron infusion.  Recommend back on oral iron.   #Etiology of iron deficiency: Prior history of "ulcers" as per patient. AUg 2023- EGD non-bleeding superficial duodenal ulcer [Dr.Vanga].  Monitor closely.  # Family Hx of cancer- father- rectal & prostate cancet at 50y [not genetically tested-confirmed with pt's mother]; paternal grand mother- stomach cancer. Will refer to  genetics .  # DISPOSITION: # HOLD venofer # referral genetic Counsellor re: family hx of cancer # follow up 6 months MD: labs- cbc;iron studies;ferritin; possible venofer-Dr.B

## 2022-07-15 NOTE — Progress Notes (Signed)
Patient has no concerns 

## 2022-07-15 NOTE — Progress Notes (Signed)
[facility] CONSULT NOTE  Patient Care Team: Doreene Nest, NP as PCP - General (Internal Medicine) Earna Coder, MD as Consulting Physician (Oncology)  CHIEF COMPLAINTS/PURPOSE OF CONSULTATION: ANEMIA   HEMATOLOGY HISTORY  # ANEMIA iron deficiency chronic intermittent- summer hemoglobin 7-s/p IV iron.  Prior history of "stomach ulcer" in 2015.  Patient s/p EGD x 2 in the past.  Last EGD- 2023-AUG     Latest Reference Range & Units 03/13/22 12:29  Iron 42 - 165 ug/dL 13 (L)  TIBC 315.1 - 761.6 mcg/dL 073.7 (H)  Saturation Ratios 20.0 - 50.0 % 2.6 (L)  Ferritin 22.0 - 322.0 ng/mL 3.9 (L)  Transferrin 212.0 - 360.0 mg/dL 106.2  (L): Data is abnormally low (H): Data is abnormally high    Latest Reference Range & Units 03/19/22 15:12  WBC 3.4 - 10.8 x10E3/uL 8.6  RBC 4.14 - 5.80 x10E6/uL 3.43 (L)  Hemoglobin 13.0 - 17.7 g/dL 7.0 (LL)  HCT 69.4 - 85.4 % 23.5 (L)  MCV 79 - 97 fL 69 (L)  MCH 26.6 - 33.0 pg 20.4 (L)  MCHC 31.5 - 35.7 g/dL 62.7 (L)  RDW 03.5 - 00.9 % 17.4 (H)  Platelets 150 - 450 x10E3/uL 491 (H)  (LL): Data is critically low (L): Data is abnormally low (H): Data is abnormally high  HISTORY OF PRESENTING ILLNESS: Ambulating; alone.   Gershon Shorten 31 y.o.  male pleasant patient with a history of recent iron deficient anemia- ?  Duodenal ulcer [EGD August 2023; Dr.Vanga] is here for follow-up.  Patient underwent IV iron infusion more recently in the last 1 month.  Noted to have improvement of his energy levels.  No blood in stools or black-colored stools.   Review of Systems  Constitutional:  Positive for malaise/fatigue. Negative for chills, diaphoresis, fever and weight loss.  HENT:  Negative for nosebleeds and sore throat.   Eyes:  Negative for double vision.  Respiratory:  Negative for cough, hemoptysis, sputum production, shortness of breath and wheezing.   Cardiovascular:  Negative for chest pain, palpitations, orthopnea  and leg swelling.  Gastrointestinal:  Positive for abdominal pain. Negative for blood in stool, constipation, diarrhea, heartburn, melena, nausea and vomiting.  Genitourinary:  Negative for dysuria, frequency and urgency.  Musculoskeletal:  Negative for back pain and joint pain.  Skin: Negative.  Negative for itching and rash.  Neurological:  Negative for dizziness, tingling, focal weakness, weakness and headaches.  Endo/Heme/Allergies:  Does not bruise/bleed easily.  Psychiatric/Behavioral:  Negative for depression. The patient is not nervous/anxious and does not have insomnia.      MEDICAL HISTORY:  Past Medical History:  Diagnosis Date   Acute blood loss anemia 10/07/2013   GI bleed 10/05/2013   Herpes    PUD (peptic ulcer disease)     SURGICAL HISTORY: Past Surgical History:  Procedure Laterality Date   COLONOSCOPY WITH PROPOFOL N/A 03/22/2022   Procedure: COLONOSCOPY WITH PROPOFOL;  Surgeon: Toney Reil, MD;  Location: [facility] ENDOSCOPY;  Service: Gastroenterology;  Laterality: N/A;   ESOPHAGOGASTRODUODENOSCOPY N/A 10/05/2013   Procedure: ESOPHAGOGASTRODUODENOSCOPY (EGD);  Surgeon: Theda Belfast, MD;  Location: [facility] ENDOSCOPY;  Service: Endoscopy;  Laterality: N/A;   ESOPHAGOGASTRODUODENOSCOPY (EGD) WITH PROPOFOL N/A 03/22/2022   Procedure: ESOPHAGOGASTRODUODENOSCOPY (EGD) WITH PROPOFOL;  Surgeon: Toney Reil, MD;  Location: [facility] ENDOSCOPY;  Service: Gastroenterology;  Laterality: N/A;   ESOPHAGOGASTRODUODENOSCOPY ENDOSCOPY     WISDOM TOOTH EXTRACTION      SOCIAL HISTORY: Social History  Socioeconomic History   Marital status: Significant Other    Spouse name: Not on file   Number of children: Not on file   Years of education: Not on file   Highest education level: Not on file  Occupational History   Not on file  Tobacco Use   Smoking status: Never   Smokeless tobacco: Never  Vaping Use   Vaping Use: Never used  Substance and Sexual Activity   Alcohol  use: Not Currently    Comment: rarely    Drug use: No   Sexual activity: Never  Other Topics Concern   Not on file  Social History Narrative   Lives in [place] area. Single; No children; Works in [employer]; Enjoys reading, playing video games, exercise.   Social Determinants of Health   Financial Resource Strain: Not on file  Food Insecurity: Not on file  Transportation Needs: No Transportation Needs (03/20/2022)   PRAPARE - Administrator, Civil Service (Medical): No    Lack of Transportation (Non-Medical): No  Physical Activity: Not on file  Stress: Not on file  Social Connections: Not on file  Intimate Partner Violence: Not on file    FAMILY HISTORY: Family History  Problem Relation Age of Onset   Diabetes Mother    CAD Mother    Diabetes Mellitus II Mother    Stroke Mother    Anemia Mother        possible blood transfusion   Rectal cancer Father        2021- in a polyp s/p resection   Prostate cancer Father        > 10 years ago- [place], [place]   Hypertension Father    Asthma Brother    Stomach cancer Paternal Grandfather    Anemia Paternal Aunt        requiring blood transfusion    ALLERGIES:  has No Known Allergies.  MEDICATIONS:  Current Outpatient Medications  Medication Sig Dispense Refill   clobetasol (TEMOVATE) 0.05 % external solution Apply 1 application. topically 2 (two) times daily. 50 mL 0   omeprazole (PRILOSEC) 40 MG capsule Take 1 capsule (40 mg total) by mouth 2 (two) times daily before a meal. For heartburn 120 capsule 1   valACYclovir (VALTREX) 500 MG tablet Take 1 tablet by mouth twice daily for three days as needed for outbreaks. 6 tablet 1   Iron-FA-B Cmp-C-Biot-Probiotic (FUSION PLUS) CAPS Take 1 capsule by mouth daily. (Patient not taking: Reported on 03/20/2022) 30 capsule 3   No current facility-administered medications for this visit.     Marland Kitchen  PHYSICAL EXAMINATION:   Vitals:   07/15/22 1503  BP: 132/81  Pulse: 70   Resp: 20  Temp: 98.6 F (37 C)  SpO2: 100%   Filed Weights   07/15/22 1503  Weight: 257 lb 1.6 oz (116.6 kg)    Physical Exam Vitals and nursing note reviewed.  HENT:     Head: Normocephalic and atraumatic.     Mouth/Throat:     Pharynx: Oropharynx is clear.  Eyes:     Extraocular Movements: Extraocular movements intact.     Pupils: Pupils are equal, round, and reactive to light.  Cardiovascular:     Rate and Rhythm: Normal rate and regular rhythm.  Pulmonary:     Comments: Decreased breath sounds bilaterally.  Abdominal:     Palpations: Abdomen is soft.  Musculoskeletal:        General: Normal range of motion.     Cervical  back: Normal range of motion.  Skin:    General: Skin is warm.  Neurological:     General: No focal deficit present.     Mental Status: He is alert and oriented to person, place, and time.  Psychiatric:        Behavior: Behavior normal.        Judgment: Judgment normal.      LABORATORY DATA:  I have reviewed the data as listed Lab Results  Component Value Date   WBC 7.9 07/15/2022   HGB 13.6 07/15/2022   HCT 42.8 07/15/2022   MCV 74.0 (L) 07/15/2022   PLT 261 07/15/2022   Recent Labs    12/18/21 0958 03/20/22 1230  NA 137 137  K 3.7 3.9  CL 102 103  CO2 27 27  GLUCOSE 90 89  BUN 11 13  CREATININE 1.21 1.14  CALCIUM 9.6 8.8*  GFRNONAA  --  >60  PROT 8.1 7.3  ALBUMIN 4.4 3.8  AST 30 27  ALT 24 20  ALKPHOS 71 51  BILITOT 0.5 0.3     No results found.  ASSESSMENT & PLAN:   Symptomatic anemia # Severe anemia hemoglobin 7 microcytic iron deficiency-given the severity of anemia recommend IV iron infusion.   Discussed the potential acute infusion reactions with IV iron; which are quite rare.  Patient understands the risk; will proceed with infusions.  Given the severe anemia recommend monitor PRBC transfusion- on 8/10; pros and cons were discussed patient agreement.  Also discussed with Dr. Allegra Lai.  #Etiology of iron  deficiency: Unclear; I had a long discussion with the patient regarding multiple etiologies of anemia including iron deficiency-which is mainly caused by blood loss/malabsorption.  Prior history of "ulcers" as per patient.  Patient status post GI evaluation-EGD colonoscopy- on 8/11.  # Family Hx of cancer- father- rectal & prostate cancet at 37y [not genetically tested-confirmed with pt's mother]; paternal grand mother- stomach cancer. Consider genetics down the line for pt/father.   Discussed with Dr. Allegra Lai.  # DISPOSITION: # labs today- ordered # Possible 1 unit PRBC tomorrow # venofer weekly x 3- start ASAP # follow up in 6 weeks MD: labs- cbc;possible venofer-Dr.B    All questions were answered. The patient knows to call the clinic with any problems, questions or concerns.    Earna Coder, MD 07/15/2022 3:21 PM

## 2022-08-14 ENCOUNTER — Inpatient Hospital Stay: Payer: BC Managed Care – PPO | Attending: Internal Medicine | Admitting: Licensed Clinical Social Worker

## 2022-08-14 ENCOUNTER — Encounter: Payer: Self-pay | Admitting: Licensed Clinical Social Worker

## 2022-08-14 ENCOUNTER — Inpatient Hospital Stay: Payer: BC Managed Care – PPO

## 2022-08-14 DIAGNOSIS — Z800 Family history of malignant neoplasm of digestive organs: Secondary | ICD-10-CM

## 2022-08-14 DIAGNOSIS — Z8042 Family history of malignant neoplasm of prostate: Secondary | ICD-10-CM

## 2022-08-14 NOTE — Progress Notes (Signed)
REFERRING PROVIDER: Cammie Sickle, MD [place],  [place] [address]  PRIMARY PROVIDER:  Pleas Koch, NP  PRIMARY REASON FOR VISIT:  1. Family history of rectal cancer   2. Family history of prostate cancer   3. Family history of colon cancer   4. Family history of stomach cancer      HISTORY OF PRESENT ILLNESS:   [name] Phillips, a 32 y.o. male, was seen for a [facility] cancer genetics consultation at the request of Dr. Rogue Bussing due to a family history of cancer.  [name] Phillips presents to clinic today to discuss the possibility of a hereditary predisposition to cancer, genetic testing, and to further clarify his future cancer risks, as well as potential cancer risks for family members.   CANCER HISTORY:  [name] Phillips is a 32 y.o. male with no personal history of cancer.   He has had a colonoscopy and upper endoscopy in the past that were normal.   Past Medical History:  Diagnosis Date   Acute blood loss anemia 10/07/2013   GI bleed 10/05/2013   Herpes    PUD (peptic ulcer disease)     Past Surgical History:  Procedure Laterality Date   COLONOSCOPY WITH PROPOFOL N/A 03/22/2022   Procedure: COLONOSCOPY WITH PROPOFOL;  Surgeon: Lin Landsman, MD;  Location: [facility];  Service: Gastroenterology;  Laterality: N/A;   ESOPHAGOGASTRODUODENOSCOPY N/A 10/05/2013   Procedure: ESOPHAGOGASTRODUODENOSCOPY (EGD);  Surgeon: Beryle Beams, MD;  Location: [facility] END[facility];  Service: Endoscopy;  Laterality: N/A;   ESOPHAGOGASTRODUODENOSCOPY (EGD) WITH PROPOFOL N/A 03/22/2022   Procedure: ESOPHAGOGASTRODUODENOSCOPY (EGD) WITH PROPOFOL;  Surgeon: Lin Landsman, MD;  Location[facility] ENDOSCOPY;  Service: Gastroenterology;  Laterality: N/A;   ESOPHAGOGASTRODUODENOSCOPY ENDOSCOPY     WISDOM TOOTH EXTRACTION      FAMILY HISTORY:  We obtained a detailed, 4-generation family history.  Significant diagnoses are listed below: Family History  Problem Relation Age  of Onset   Diabetes Mother    CAD Mother    Diabetes Mellitus II Mother    Stroke Mother    Anemia Mother        possible blood transfusion   Rectal cancer Father        2021- in a polyp s/p resection   Prostate cancer Father        > 10 years ago- [place], [place]   Hypertension Father    Asthma Brother    Colon cancer Maternal Uncle    Anemia Paternal Aunt        requiring blood transfusion   Stomach cancer Paternal Grandfather    [name] Phillips has 2 maternal half brothers and 1 adopted sister, none have had cancer.  [name] Phillips mother passed at 41. Patient had 3 maternal uncles, 3 maternal aunts. One uncle had colon cancer at unk age.   [name] Phillips father had prostate cancer around age 48 and rectal cancer around age 74. Paternal grandfather had stomach cancer over age 33.   [name] Phillips is unaware of previous family history of genetic testing for hereditary cancer risks. There is no reported Ashkenazi Jewish ancestry. There is no known consanguinity.    GENETIC COUNSELING ASSESSMENT: [name] Phillips is a 32 y.o. male with a family history of rectal, stomach and prostate which is somewhat suggestive of a hereditary cancer syndrome and predisposition to cancer. We, therefore, discussed and recommended the following at today's visit.   DISCUSSION: We discussed that approximately 10% of colorectal cancer is hereditary. Most cases  of hereditary colorectal cancer are associated with Lynch syndrome genes, which can also increase risk for stomach and prostate cancer, although there are other genes associated with hereditary cancer as well. Cancers and risks are gene specific. We discussed that testing is beneficial for several reasons including knowing about cancer risks, identifying potential screening and risk-reduction options that may be appropriate, and to understand if other family members could be at risk for cancer and allow them to undergo genetic testing.   We reviewed the  characteristics, features and inheritance patterns of hereditary cancer syndromes. We also discussed genetic testing, including the appropriate family members to test, the process of testing, insurance coverage and turn-around-time for results. We discussed the implications of a negative, positive and/or variant of uncertain significant result. We recommended [name] Phillips pursue genetic testing for the [facility] CustomNext+RNA gene panel.   Based on [name] Phillips's family history of cancer, he meets medical criteria for genetic testing. Despite that he meets criteria, he may still have an out of pocket cost. We discussed that if his out of pocket cost for testing is over $100, the laboratory will call and confirm whether he wants to proceed with testing.  If the out of pocket cost of testing is less than $100 he will be billed by the genetic testing laboratory.   We discussed that some people do not want to undergo genetic testing due to fear of genetic discrimination.  A federal law called the Genetic Information Non-Discrimination Act (GINA) of 2008 helps protect individuals against genetic discrimination based on their genetic test results.  It impacts both health insurance and employment.  For health insurance, it protects against increased premiums, being kicked off insurance or being forced to take a test in order to be insured.  For employment it protects against hiring, firing and promoting decisions based on genetic test results.  Health status due to a cancer diagnosis is not protected under GINA.  This law does not protect life insurance, disability insurance, or other types of insurance.   PLAN: After considering the risks, benefits, and limitations, [name] Phillips provided informed consent to pursue genetic testing and the blood sample was sent to [facility] for analysis of the CustomNext+RNA panel. Results should be available within approximately 2-3 weeks' time, at which point they will be  disclosed by telephone to [name] Phillips, as will any additional recommendations warranted by these results. [name] Phillips will receive a summary of his genetic counseling visit and a copy of his results once available. This information will also be available in Epic.   [name] Phillips questions were answered to his satisfaction today. Our contact information was provided should additional questions or concerns arise. Thank you for the referral and allowing [place] to share in the care of your patient.   Faith Rogue, MS, [facility] Genetic Counselor [email or web address] Phone: [phone]  The patient was seen for a total of 25 minutes in face-to-face genetic counseling.  Dr. Grayland Ormond was available for discussion regarding this case.   _______________________________________________________________________ For Office Staff:  Number of people involved in session: 1 Was an Intern/ student involved with case: no

## 2022-08-16 ENCOUNTER — Encounter: Payer: Self-pay | Admitting: Internal Medicine

## 2022-08-27 ENCOUNTER — Encounter: Payer: Self-pay | Admitting: Licensed Clinical Social Worker

## 2022-08-27 ENCOUNTER — Telehealth: Payer: Self-pay | Admitting: Licensed Clinical Social Worker

## 2022-08-28 ENCOUNTER — Encounter: Payer: Self-pay | Admitting: Licensed Clinical Social Worker

## 2022-08-28 ENCOUNTER — Ambulatory Visit: Payer: Self-pay | Admitting: Licensed Clinical Social Worker

## 2022-08-28 ENCOUNTER — Encounter: Payer: Self-pay | Admitting: Internal Medicine

## 2022-08-28 DIAGNOSIS — Z1379 Encounter for other screening for genetic and chromosomal anomalies: Secondary | ICD-10-CM

## 2022-08-28 NOTE — Progress Notes (Signed)
HPI:   [name] Phillips was previously seen in the [name] clinic due to a family history of cancer and concerns regarding a hereditary predisposition to cancer. Please refer to our prior cancer genetics clinic note for more information regarding our discussion, assessment and recommendations, at the time. [name] Phillips recent genetic test results were disclosed to him, as were recommendations warranted by these results. These results and recommendations are discussed in more detail below.  CANCER HISTORY:  Oncology History   No history exists.    FAMILY HISTORY:  We obtained a detailed, 4-generation family history.  Significant diagnoses are listed below: Family History  Problem Relation Age of Onset   Diabetes Mother    CAD Mother    Diabetes Mellitus II Mother    Stroke Mother    Anemia Mother        possible blood transfusion   Rectal cancer Father        2021- in a polyp s/p resection   Prostate cancer Father        > 10 years ago- [place]eralyn Ruth[place], [place]   Hypertension Father    Asthma Brother    Colon cancer Maternal Uncle    Anemia Paternal Aunt        requiring blood transfusion   Stomach cancer Paternal Grandfather    [name] Phillips has 2 maternal half brothers and 1 adopted sister, none have had cancer.   [name] Phillips mother passed at 44. Patient had 3 maternal uncles, 3 maternal aunts. One uncle had colon cancer at unk age.    [name] Phillips father had prostate cancer around age 42 and rectal cancer around age 36. Paternal grandfather had stomach cancer over age 28.    [name] Phillips is unaware of previous family history of genetic testing for hereditary cancer risks. There is no reported Ashkenazi Jewish ancestry. There is no known consanguinity.      GENETIC TEST RESULTS:  The [facility] CustomNext-Cancer+RNA Panel found no pathogenic mutations.  The CustomNext-Cancer + RNAinsight panel  includes sequencing and/or deletion duplication testing of the following  47 genes:  APC*, ATM*, AXIN2, BAP1, BARD1, BMPR1A, BRCA1, BRCA2, BRIP1, CDH1, CDK4, CDKN2A (p14ARF), CDKN2A (p16INK4a), CHEK2, CTNNA1, DICER1*, EPCAM*, FH*, GREM1*, HOXB13, KIT, MEN1*, MLH1*, MSH2*, MSH3*, MSH6*, MUTYH, NF1*, NTHL1, PALB2, PDGFRA, PMS2*, POLD1*, POLE, PTEN*, RAD51C, RAD51D, SDHA*, SDHB, SDHC*, SDHD, SMAD4, SMARCA4, STK11, TP53, TSC1*, TSC2, VHL.   The test report has been scanned into EPIC and is located under the Molecular Pathology section of the Results Review tab.  A portion of the result report is included below for reference. Genetic testing reported out on 08/27/2022.    Even though a pathogenic variant was not identified, possible explanations for the cancer in the family may include: There may be no hereditary risk for cancer in the family. The cancers in [name] Phillips and/or his family may be sporadic/familial or due to other genetic and environmental factors. There may be a gene mutation in one of these genes that current testing methods cannot detect but that chance is small. There could be another gene that has not yet been discovered, or that we have not yet tested, that is responsible for the cancer diagnoses in the family.  It is also possible there is a hereditary cause for the cancer in the family that [name] Phillips did not inherit.  Therefore, it is important to remain in touch with cancer genetics in the future so that we can continue to offer [name] Phillips the  most up to date genetic testing.    ADDITIONAL GENETIC TESTING:  We discussed with [name] Phillips that his genetic testing was fairly extensive.  If there are additional relevant genes identified to increase cancer risk that can be analyzed in the future, we would be happy to discuss and coordinate this testing at that time.    CANCER SCREENING RECOMMENDATIONS:  [name] Phillips test result is considered negative (normal).  This means that we have not identified a hereditary cause for his family history of cancer  at this time.   An individual's cancer risk and medical management are not determined by genetic test results alone. Overall cancer risk assessment incorporates additional factors, including personal medical history, family history, and any available genetic information that may result in a personalized plan for cancer prevention and surveillance. Therefore, it is recommended he continue to follow the cancer management and screening guidelines provided by his primary healthcare provider.  Based on the reported personal and family history, specific cancer screenings for [name] Phillips and his family include:  Colon Cancer Screening: Due to [name] Phillips's family's history of colon cancer, he is recommended to repeat colonoscopies at least every 5 years once he starts having them. More frequent colonoscopies may be recommended if polyps are identified.  RECOMMENDATIONS FOR FAMILY MEMBERS:   Since he did not inherit a identifiable mutation in a cancer predisposition gene included on this panel, his children could not have inherited a known mutation from him in one of these genes. Individuals in this family might be at some increased risk of developing cancer, over the general population risk, due to the family history of cancer.  Individuals in the family should notify their providers of the family history of cancer. We recommend women in this family have a yearly mammogram beginning at age 18, or 63 years younger than the earliest onset of cancer, an annual clinical breast exam, and perform monthly breast self-exams.  Family members should have colonoscopies by at age 2, or earlier, as recommended by their providers.  FOLLOW-UP:  Lastly, we discussed with [name] Phillips that cancer genetics is a rapidly advancing field and it is possible that new genetic tests will be appropriate for him and/or his family members in the future. We encouraged him to remain in contact with cancer genetics on an annual  basis so we can update his personal and family histories and let him know of advances in cancer genetics that may benefit this family.   Our contact number was provided. [name] Phillips questions were answered to his satisfaction, and he knows he is welcome to call us at anytime with additional questions or concerns.    Faith Rogue, MS, [facility] Genetic Counselor [email or web address] Phone: [phone]

## 2022-08-28 NOTE — Telephone Encounter (Signed)
I contacted Mr. Arp to discuss his genetic testing results. No pathogenic variants were identified in the 47 genes analyzed. Detailed clinic note to follow.   The test report has been scanned into EPIC and is located under the Molecular Pathology section of the Results Review tab.  A portion of the result report is included below for reference.      Faith Rogue, MS, [facility] Genetic Counselor [email or web address] Phone: [phone]

## 2023-01-14 ENCOUNTER — Inpatient Hospital Stay: Payer: BC Managed Care – PPO | Attending: Internal Medicine

## 2023-01-14 ENCOUNTER — Encounter: Payer: Self-pay | Admitting: Internal Medicine

## 2023-01-14 ENCOUNTER — Inpatient Hospital Stay (HOSPITAL_BASED_OUTPATIENT_CLINIC_OR_DEPARTMENT_OTHER): Payer: BC Managed Care – PPO | Admitting: Internal Medicine

## 2023-01-14 ENCOUNTER — Telehealth: Payer: Self-pay

## 2023-01-14 ENCOUNTER — Inpatient Hospital Stay: Payer: BC Managed Care – PPO

## 2023-01-14 VITALS — BP 138/73 | HR 77 | Temp 97.8°F | Ht 74.0 in | Wt 255.6 lb

## 2023-01-14 VITALS — BP 131/72

## 2023-01-14 DIAGNOSIS — D509 Iron deficiency anemia, unspecified: Secondary | ICD-10-CM | POA: Insufficient documentation

## 2023-01-14 DIAGNOSIS — D649 Anemia, unspecified: Secondary | ICD-10-CM

## 2023-01-14 DIAGNOSIS — Z79899 Other long term (current) drug therapy: Secondary | ICD-10-CM | POA: Diagnosis not present

## 2023-01-14 LAB — CBC WITH DIFFERENTIAL/PLATELET
Abs Immature Granulocytes: 0.02 10*3/uL (ref 0.00–0.07)
Basophils Absolute: 0 10*3/uL (ref 0.0–0.1)
Basophils Relative: 1 %
Eosinophils Absolute: 0.3 10*3/uL (ref 0.0–0.5)
Eosinophils Relative: 4 %
HCT: 36.6 % — ABNORMAL LOW (ref 39.0–52.0)
Hemoglobin: 10.9 g/dL — ABNORMAL LOW (ref 13.0–17.0)
Immature Granulocytes: 0 %
Lymphocytes Relative: 25 %
Lymphs Abs: 1.8 10*3/uL (ref 0.7–4.0)
MCH: 20.5 pg — ABNORMAL LOW (ref 26.0–34.0)
MCHC: 29.8 g/dL — ABNORMAL LOW (ref 30.0–36.0)
MCV: 68.8 fL — ABNORMAL LOW (ref 80.0–100.0)
Monocytes Absolute: 0.7 10*3/uL (ref 0.1–1.0)
Monocytes Relative: 9 %
Neutro Abs: 4.4 10*3/uL (ref 1.7–7.7)
Neutrophils Relative %: 61 %
Platelets: 268 10*3/uL (ref 150–400)
RBC: 5.32 MIL/uL (ref 4.22–5.81)
RDW: 19.9 % — ABNORMAL HIGH (ref 11.5–15.5)
WBC: 7.2 10*3/uL (ref 4.0–10.5)
nRBC: 0 % (ref 0.0–0.2)

## 2023-01-14 LAB — IRON AND TIBC
Iron: 35 ug/dL — ABNORMAL LOW (ref 45–182)
Saturation Ratios: 8 % — ABNORMAL LOW (ref 17.9–39.5)
TIBC: 431 ug/dL (ref 250–450)
UIBC: 396 ug/dL

## 2023-01-14 LAB — FERRITIN: Ferritin: 7 ng/mL — ABNORMAL LOW (ref 24–336)

## 2023-01-14 MED ORDER — SODIUM CHLORIDE 0.9 % IV SOLN
200.0000 mg | Freq: Once | INTRAVENOUS | Status: AC
  Administered 2023-01-14: 200 mg via INTRAVENOUS
  Filled 2023-01-14: qty 200

## 2023-01-14 MED ORDER — SODIUM CHLORIDE 0.9 % IV SOLN
Freq: Once | INTRAVENOUS | Status: AC
  Filled 2023-01-14: qty 250

## 2023-01-14 NOTE — Telephone Encounter (Signed)
Per dr. Federico Flake, please schedule EGD for IDA, h/o PUD, check serum fasting gastrin levels and follow up with Inetta Fermo

## 2023-01-14 NOTE — Assessment & Plan Note (Addendum)
#   Severe anemia hemoglobin [AUG 2023] 7 microcytic iron deficiency-given the severity of anemia  s/p Venofer Stopped  oral iron; currently on iron rich foods.   # Today hemoglobin is 10.3- proceed with venofer. And re-start PO iron.    #Etiology of iron deficiency: Prior history of "ulcers" as per patient. AUg 2023- EGD non-bleeding superficial duodenal ulcer [Dr.Vanga].  The etiology is unclear. Last upper endoscopy August 2023. ? capsule vs- repeat EGD.? ZES- Discussed with Dr.Vanga.   # Family Hx of cancer- father- rectal & prostate cancet at 19y [not genetically tested-confirmed with pt's mother]; paternal grand mother- stomach cancer. S/p  genetics  counseling- NEGATIVE.   # DISPOSITION: # proceed with  venofer # follow up 4  months MD: labs- cbc;iron studies;ferritin; possible venofer-Dr.B

## 2023-01-14 NOTE — Progress Notes (Signed)
Energy is better. Appetite is good. Denies and dyspnea, no dizziness. No visible blood in stool.

## 2023-01-14 NOTE — Progress Notes (Signed)
[facility] CONSULT NOTE  Patient Care Team: Doreene Nest, NP as PCP - General (Internal Medicine) Earna Coder, MD as Consulting Physician (Oncology)  CHIEF COMPLAINTS/PURPOSE OF CONSULTATION: ANEMIA   HEMATOLOGY HISTORY  # ANEMIA iron deficiency chronic intermittent- summer hemoglobin 7-s/p IV iron.  Prior history of "stomach ulcer" in 2015.  Patient s/p EGD x 2 in the past.  Last EGD- 2023-AUG     Latest Reference Range & Units 03/13/22 12:29  Iron 42 - 165 ug/dL 13 (L)  TIBC 161.0 - 960.4 mcg/dL 540.9 (H)  Saturation Ratios 20.0 - 50.0 % 2.6 (L)  Ferritin 22.0 - 322.0 ng/mL 3.9 (L)  Transferrin 212.0 - 360.0 mg/dL 811.9  (L): Data is abnormally low (H): Data is abnormally high    Latest Reference Range & Units 03/19/22 15:12  WBC 3.4 - 10.8 x10E3/uL 8.6  RBC 4.14 - 5.80 x10E6/uL 3.43 (L)  Hemoglobin 13.0 - 17.7 g/dL 7.0 (LL)  HCT 14.7 - 82.9 % 23.5 (L)  MCV 79 - 97 fL 69 (L)  MCH 26.6 - 33.0 pg 20.4 (L)  MCHC 31.5 - 35.7 g/dL 56.2 (L)  RDW 13.0 - 86.5 % 17.4 (H)  Platelets 150 - 450 x10E3/uL 491 (H)  (LL): Data is critically low (L): Data is abnormally low (H): Data is abnormally high  HISTORY OF PRESENTING ILLNESS: Ambulating; alone.   [name] Phillips 32 y.o.  male pleasant patient with a history of recent iron deficient anemia- ?  Duodenal ulcer [EGD August 2023; Dr.Vanga] is here for follow-up.  Patient stopped taking iron pills.  Is currently on iron rich foods.  Patient states that his energy is better. Appetite is good. Denies and dyspnea, no dizziness. No visible blood in stool.    Review of Systems  Constitutional:  Negative for chills, diaphoresis, fever and weight loss.  HENT:  Negative for nosebleeds and sore throat.   Eyes:  Negative for double vision.  Respiratory:  Negative for cough, hemoptysis, sputum production, shortness of breath and wheezing.   Cardiovascular:  Negative for chest pain, palpitations, orthopnea  and leg swelling.  Gastrointestinal:  Negative for blood in stool, constipation, diarrhea, heartburn, melena, nausea and vomiting.  Genitourinary:  Negative for dysuria, frequency and urgency.  Musculoskeletal:  Negative for back pain and joint pain.  Skin: Negative.  Negative for itching and rash.  Neurological:  Negative for dizziness, tingling, focal weakness, weakness and headaches.  Endo/Heme/Allergies:  Does not bruise/bleed easily.  Psychiatric/Behavioral:  Negative for depression. The patient is not nervous/anxious and does not have insomnia.      MEDICAL HISTORY:  Past Medical History:  Diagnosis Date   Acute blood loss anemia 10/07/2013   GI bleed 10/05/2013   Herpes    PUD (peptic ulcer disease)     SURGICAL HISTORY: Past Surgical History:  Procedure Laterality Date   COLONOSCOPY WITH PROPOFOL N/A 03/22/2022   Procedure: COLONOSCOPY WITH PROPOFOL;  Surgeon: Toney Reil, MD;  Location: [facility] ENDOSCOPY;  Service: Gastroenterology;  Laterality: N/A;   ESOPHAGOGASTRODUODENOSCOPY N/A 10/05/2013   Procedure: ESOPHAGOGASTRODUODENOSCOPY (EGD);  Surgeon: Theda Belfast, MD;  Location: [facility] ENDOSCOPY;  Service: Endoscopy;  Laterality: N/A;   ESOPHAGOGASTRODUODENOSCOPY (EGD) WITH PROPOFOL N/A 03/22/2022   Procedure: ESOPHAGOGASTRODUODENOSCOPY (EGD) WITH PROPOFOL;  Surgeon: Toney Reil, MD;  Location: [facility] ENDOSCOPY;  Service: Gastroenterology;  Laterality: N/A;   ESOPHAGOGASTRODUODENOSCOPY ENDOSCOPY     WISDOM TOOTH EXTRACTION      SOCIAL HISTORY: Social History   Socioeconomic History  Marital status: Significant Other    Spouse name: Not on file   Number of children: Not on file   Years of education: Not on file   Highest education level: Not on file  Occupational History   Not on file  Tobacco Use   Smoking status: Never   Smokeless tobacco: Never  Vaping Use   Vaping Use: Never used  Substance and Sexual Activity   Alcohol use: Not Currently    Comment:  rarely    Drug use: No   Sexual activity: Never  Other Topics Concern   Not on file  Social History Narrative   Lives in [place] area. Single; No children; Works in [place] [place]; Enjoys reading, playing video games, exercise.   Social Determinants of Health   Financial Resource Strain: Not on file  Food Insecurity: Not on file  Transportation Needs: No Transportation Needs (03/20/2022)   PRAPARE - Administrator, Civil Service (Medical): No    Lack of Transportation (Non-Medical): No  Physical Activity: Not on file  Stress: Not on file  Social Connections: Not on file  Intimate Partner Violence: Not on file    FAMILY HISTORY: Family History  Problem Relation Age of Onset   Diabetes Mother    CAD Mother    Diabetes Mellitus II Mother    Stroke Mother    Anemia Mother        possible blood transfusion   Rectal cancer Father        2021- in a polyp s/p resection   Prostate cancer Father        > 10 years ago- [place], [place]   Hypertension Father    Asthma Brother    Colon cancer Maternal Uncle    Anemia Paternal Aunt        requiring blood transfusion   Stomach cancer Paternal Grandfather     ALLERGIES:  has No Known Allergies.  MEDICATIONS:  Current Outpatient Medications  Medication Sig Dispense Refill   clobetasol (TEMOVATE) 0.05 % external solution Apply 1 application. topically 2 (two) times daily. 50 mL 0   omeprazole (PRILOSEC) 40 MG capsule Take 1 capsule (40 mg total) by mouth 2 (two) times daily before a meal. For heartburn 120 capsule 1   Iron-FA-B Cmp-C-Biot-Probiotic (FUSION PLUS) CAPS Take 1 capsule by mouth daily. (Patient not taking: Reported on 03/20/2022) 30 capsule 3   valACYclovir (VALTREX) 500 MG tablet Take 1 tablet by mouth twice daily for three days as needed for outbreaks. (Patient not taking: Reported on 01/14/2023) 6 tablet 1   No current facility-administered medications for this visit.     Marland Kitchen  PHYSICAL EXAMINATION:   Vitals:    01/14/23 1337  BP: 138/73  Pulse: 77  Temp: 97.8 F (36.6 C)  SpO2: 100%   Filed Weights   01/14/23 1337  Weight: 255 lb 9.6 oz (115.9 kg)    Physical Exam Vitals and nursing note reviewed.  HENT:     Head: Normocephalic and atraumatic.     Mouth/Throat:     Pharynx: Oropharynx is clear.  Eyes:     Extraocular Movements: Extraocular movements intact.     Pupils: Pupils are equal, round, and reactive to light.  Cardiovascular:     Rate and Rhythm: Normal rate and regular rhythm.  Pulmonary:     Comments: Decreased breath sounds bilaterally.  Abdominal:     Palpations: Abdomen is soft.  Musculoskeletal:        General: Normal range of  motion.     Cervical back: Normal range of motion.  Skin:    General: Skin is warm.  Neurological:     General: No focal deficit present.     Mental Status: He is alert and oriented to person, place, and time.  Psychiatric:        Behavior: Behavior normal.        Judgment: Judgment normal.      LABORATORY DATA:  I have reviewed the data as listed Lab Results  Component Value Date   WBC 7.2 01/14/2023   HGB 10.9 (L) 01/14/2023   HCT 36.6 (L) 01/14/2023   MCV 68.8 (L) 01/14/2023   PLT 268 01/14/2023   Recent Labs    03/20/22 1230  NA 137  K 3.9  CL 103  CO2 27  GLUCOSE 89  BUN 13  CREATININE 1.14  CALCIUM 8.8*  GFRNONAA >60  PROT 7.3  ALBUMIN 3.8  AST 27  ALT 20  ALKPHOS 51  BILITOT 0.3     No results found.  ASSESSMENT & PLAN:   Symptomatic anemia # Severe anemia hemoglobin [AUG 2023] 7 microcytic iron deficiency-given the severity of anemia  s/p Venofer Stopped  oral iron; currently on iron rich foods.   # Today hemoglobin is 10.3- proceed with venofer. And re-start PO iron.    #Etiology of iron deficiency: Prior history of "ulcers" as per patient. AUg 2023- EGD non-bleeding superficial duodenal ulcer [Dr.Vanga].  The etiology is unclear. Last upper endoscopy August 2023. ? capsule vs- repeat EGD.? ZES-  Discussed with Dr.Vanga.   # Family Hx of cancer- father- rectal & prostate cancet at 81y [not genetically tested-confirmed with pt's mother]; paternal grand mother- stomach cancer. S/p  genetics  counseling- NEGATIVE.   # DISPOSITION: # proceed with  venofer # follow up 4  months MD: labs- cbc;iron studies;ferritin; possible venofer-Dr.B  All questions were answered. The patient knows to call the clinic with any problems, questions or concerns.    Earna Coder, MD 01/14/2023 2:47 PM

## 2023-01-14 NOTE — Patient Instructions (Signed)

## 2023-01-15 NOTE — Telephone Encounter (Signed)
Called and left a message for call back  

## 2023-01-16 NOTE — Telephone Encounter (Signed)
Called and left a message for call back  

## 2023-01-16 NOTE — Telephone Encounter (Signed)
Called and left a message for call back. Sent mychart message  °

## 2023-01-17 NOTE — Telephone Encounter (Signed)
Called and left a message for call back. Sent mychart message and patient read it but did not respond. Please advise

## 2023-01-17 NOTE — Telephone Encounter (Signed)
Try calling him next week, if he does not respond, please send him a letter  RV

## 2023-01-20 NOTE — Telephone Encounter (Signed)
Called and left a message for call back  

## 2023-01-21 NOTE — Telephone Encounter (Signed)
Called and left a message for call back  

## 2023-01-22 ENCOUNTER — Encounter: Payer: Self-pay | Admitting: Internal Medicine

## 2023-01-22 ENCOUNTER — Other Ambulatory Visit: Payer: Self-pay

## 2023-01-22 DIAGNOSIS — D509 Iron deficiency anemia, unspecified: Secondary | ICD-10-CM

## 2023-01-22 NOTE — Addendum Note (Signed)
Addended by: Radene Knee L on: 01/22/2023 10:26 AM   Modules accepted: Orders

## 2023-01-22 NOTE — Telephone Encounter (Signed)
Called patient and patient verbalized understanding of instructions. He states he will have fasting labs at [facility] one morning. Offered patient a EGD for next week on the 18 or 20 but patient declined both days. Informed patient after next week Dr. Allegra Lai goes on vacation for a month and they wanted this done ASAP. He said okay that is fine he will do it when she gets back. Schedule the EGD for 03/13/2023. Sent instructions to [employer], mailed them. Made follow up appointment after EGD with Inetta Fermo

## 2023-03-10 ENCOUNTER — Telehealth: Payer: Self-pay | Admitting: *Deleted

## 2023-03-10 NOTE — Telephone Encounter (Signed)
Patient left a voicemail, stating that he need to reschedule procedure due to work and not having someone to drive him home.  Requesting to reschedule to Friday, 04/18/2023  Called endo unit to make the change.  New instructions will be sent to patient.

## 2023-03-20 ENCOUNTER — Ambulatory Visit: Payer: BC Managed Care – PPO | Admitting: Physician Assistant

## 2023-04-17 ENCOUNTER — Encounter: Payer: Self-pay | Admitting: Gastroenterology

## 2023-04-17 ENCOUNTER — Telehealth: Payer: Self-pay

## 2023-04-17 NOTE — Telephone Encounter (Signed)
Pt called to reschedule (EGD) requesting return call

## 2023-04-18 ENCOUNTER — Ambulatory Visit
Admission: RE | Admit: 2023-04-18 | Payer: BC Managed Care – PPO | Source: Home / Self Care | Admitting: Gastroenterology

## 2023-04-18 ENCOUNTER — Encounter: Admission: RE | Payer: Self-pay | Source: Home / Self Care

## 2023-04-18 SURGERY — ESOPHAGOGASTRODUODENOSCOPY (EGD) WITH PROPOFOL
Anesthesia: General

## 2023-04-18 NOTE — Telephone Encounter (Signed)
Called and left a message for call back. Patient EGD is today

## 2023-04-18 NOTE — Telephone Encounter (Signed)
Patient did not show up for EGD today. Called and left a message for call back

## 2023-05-07 NOTE — Progress Notes (Deleted)
[name] Amy, PA-C [address]  [address]  [place], [place] [address]  Main: [phone]  Fax: [phone]   Primary Care Physician: Doreene Nest, NP  Primary Gastroenterologist:  [name] Amy, PA-C / Dr. Lannette Donath    CC: F/U IDA and PUD  HPI: [name] Phillips is a 32 y.o. male returns for follow-up of iron deficiency anemia and peptic ulcer disease.  History of GI bleed in 2015 secondary to duodenal ulcer.  EGD 03/2022 by Dr. Allegra Lai showed 1 nonbleeding 20 mm superficial duodenal ulcer in the second part of duodenum.  Otherwise normal.  Gastric biopsies negative for H. pylori and intestinal metaplasia.  Treated with omeprazole 40 Mg twice daily for 3 months.  Colonoscopy 03/2022 showed good prep, nonbleeding external hemorrhoids, otherwise normal.  Lab 03/2022 showed hemoglobin 7.0. Lab 01/2023 showed hemoglobin 10.9, MCV 68, ferritin 7, iron 35, saturation 8%.  Normal serum gastrin of 54.  NSAIDs? Iron? GI symptoms?  Current Outpatient Medications  Medication Sig Dispense Refill   clobetasol (TEMOVATE) 0.05 % external solution Apply 1 application. topically 2 (two) times daily. 50 mL 0   Iron-FA-B Cmp-C-Biot-Probiotic (FUSION PLUS) CAPS Take 1 capsule by mouth daily. (Patient not taking: Reported on 03/20/2022) 30 capsule 3   omeprazole (PRILOSEC) 40 MG capsule Take 1 capsule (40 mg total) by mouth 2 (two) times daily before a meal. For heartburn 120 capsule 1   valACYclovir (VALTREX) 500 MG tablet Take 1 tablet by mouth twice daily for three days as needed for outbreaks. (Patient not taking: Reported on 01/14/2023) 6 tablet 1   No current facility-administered medications for this visit.    Allergies as of 05/08/2023   (No Known Allergies)    Past Medical History:  Diagnosis Date   Acute blood loss anemia 10/07/2013   GI bleed 10/05/2013   Herpes    PUD (peptic ulcer disease)     Past Surgical History:  Procedure Laterality Date   COLONOSCOPY WITH  PROPOFOL N/A 03/22/2022   Procedure: COLONOSCOPY WITH PROPOFOL;  Surgeon: Toney Reil, MD;  Location: [facility] ENDOSCOPY;  Service: Gastroenterology;  Laterality: N/A;   ESOPHAGOGASTRODUODENOSCOPY N/A 10/05/2013   Procedure: ESOPHAGOGASTRODUODENOSCOPY (EGD);  Surgeon: Theda Belfast, MD;  Location: [facility] ENDOSCOPY;  Service: Endoscopy;  Laterality: N/A;   ESOPHAGOGASTRODUODENOSCOPY (EGD) WITH PROPOFOL N/A 03/22/2022   Procedure: ESOPHAGOGASTRODUODENOSCOPY (EGD) WITH PROPOFOL;  Surgeon: Toney Reil, MD;  Location: [facility] ENDOSCOPY;  Service: Gastroenterology;  Laterality: N/A;   ESOPHAGOGASTRODUODENOSCOPY ENDOSCOPY     WISDOM TOOTH EXTRACTION      Review of Systems:    All systems reviewed and negative except where noted in HPI.   Physical Examination:   There were no vitals taken for this visit.  General: Well-nourished, well-developed in no acute distress.  Lungs: Clear to auscultation bilaterally. Non-labored. Heart: Regular rate and rhythm, no murmurs rubs or gallops.  Abdomen: Bowel sounds are normal; Abdomen is Soft; No hepatosplenomegaly, masses or hernias;  No Abdominal Tenderness; No guarding or rebound tenderness. Neuro: Alert and oriented x 3.  Grossly intact.  Psych: Alert and cooperative, normal mood and affect.   Imaging Studies: No results found.  Assessment and Plan:   [name] Phillips is a 32 y.o. y/o male   1.  Iron deficiency anemia  Labs: CBC, iron panel, ferritin, and celiac lab.   2.  History of duodenal ulcer (biopsies negative for H. pylori).  Continue omeprazole 40 Mg daily indefinitely  Avoid NSAIDs   [name] Amy, PA-C  Follow up ***  BP check ***

## 2023-05-08 ENCOUNTER — Ambulatory Visit: Payer: BC Managed Care – PPO | Admitting: Physician Assistant

## 2023-05-19 ENCOUNTER — Inpatient Hospital Stay: Payer: BC Managed Care – PPO | Admitting: Internal Medicine

## 2023-05-19 ENCOUNTER — Inpatient Hospital Stay: Payer: BC Managed Care – PPO

## 2023-05-19 ENCOUNTER — Encounter: Payer: Self-pay | Admitting: Internal Medicine

## 2023-05-19 ENCOUNTER — Inpatient Hospital Stay: Payer: BC Managed Care – PPO | Attending: Internal Medicine

## 2023-05-19 ENCOUNTER — Ambulatory Visit: Payer: BC Managed Care – PPO

## 2023-08-11 ENCOUNTER — Encounter: Payer: Self-pay | Admitting: Internal Medicine
